# Patient Record
Sex: Male | Born: 1935 | Race: White | Hispanic: No | Smoking: Never smoker
Health system: Southern US, Community
[De-identification: ages and names within clinical notes are randomized; demographics above are authoritative.]

## PROBLEM LIST (undated history)

## (undated) DIAGNOSIS — I10 Essential (primary) hypertension: Secondary | ICD-10-CM

## (undated) DIAGNOSIS — J449 Chronic obstructive pulmonary disease, unspecified: Secondary | ICD-10-CM

## (undated) HISTORY — PX: EYE SURGERY: SHX253

---

## 2001-05-23 ENCOUNTER — Encounter: Payer: Self-pay | Admitting: Emergency Medicine

## 2001-05-23 ENCOUNTER — Inpatient Hospital Stay (HOSPITAL_COMMUNITY): Admission: EM | Admit: 2001-05-23 | Discharge: 2001-05-28 | Payer: Self-pay | Admitting: Emergency Medicine

## 2001-05-25 ENCOUNTER — Encounter: Payer: Self-pay | Admitting: Cardiology

## 2005-03-25 ENCOUNTER — Encounter: Payer: Self-pay | Admitting: Cardiology

## 2009-08-24 ENCOUNTER — Encounter: Payer: Self-pay | Admitting: Cardiology

## 2009-08-25 ENCOUNTER — Encounter: Payer: Self-pay | Admitting: Cardiology

## 2009-08-30 ENCOUNTER — Ambulatory Visit: Payer: Self-pay | Admitting: Cardiology

## 2009-08-30 ENCOUNTER — Encounter (INDEPENDENT_AMBULATORY_CARE_PROVIDER_SITE_OTHER): Payer: Self-pay | Admitting: *Deleted

## 2009-08-30 DIAGNOSIS — J449 Chronic obstructive pulmonary disease, unspecified: Secondary | ICD-10-CM

## 2009-08-30 DIAGNOSIS — R0602 Shortness of breath: Secondary | ICD-10-CM | POA: Insufficient documentation

## 2009-08-30 DIAGNOSIS — I10 Essential (primary) hypertension: Secondary | ICD-10-CM

## 2009-08-30 DIAGNOSIS — E785 Hyperlipidemia, unspecified: Secondary | ICD-10-CM | POA: Insufficient documentation

## 2009-08-30 DIAGNOSIS — R9430 Abnormal result of cardiovascular function study, unspecified: Secondary | ICD-10-CM | POA: Insufficient documentation

## 2009-08-30 DIAGNOSIS — K219 Gastro-esophageal reflux disease without esophagitis: Secondary | ICD-10-CM

## 2009-08-30 DIAGNOSIS — J4489 Other specified chronic obstructive pulmonary disease: Secondary | ICD-10-CM | POA: Insufficient documentation

## 2009-09-03 ENCOUNTER — Ambulatory Visit: Payer: Self-pay | Admitting: Cardiology

## 2009-09-03 ENCOUNTER — Inpatient Hospital Stay (HOSPITAL_BASED_OUTPATIENT_CLINIC_OR_DEPARTMENT_OTHER): Admission: RE | Admit: 2009-09-03 | Discharge: 2009-09-03 | Payer: Self-pay | Admitting: Cardiology

## 2009-09-07 ENCOUNTER — Encounter: Payer: Self-pay | Admitting: Cardiology

## 2009-09-07 ENCOUNTER — Observation Stay (HOSPITAL_COMMUNITY): Admission: RE | Admit: 2009-09-07 | Discharge: 2009-09-09 | Payer: Self-pay | Admitting: Cardiovascular Disease

## 2009-09-07 ENCOUNTER — Ambulatory Visit: Payer: Self-pay | Admitting: Cardiovascular Disease

## 2009-09-08 ENCOUNTER — Encounter: Payer: Self-pay | Admitting: Cardiology

## 2009-09-12 ENCOUNTER — Telehealth (INDEPENDENT_AMBULATORY_CARE_PROVIDER_SITE_OTHER): Payer: Self-pay | Admitting: *Deleted

## 2009-10-01 ENCOUNTER — Ambulatory Visit: Payer: Self-pay | Admitting: Cardiology

## 2009-10-01 DIAGNOSIS — I2510 Atherosclerotic heart disease of native coronary artery without angina pectoris: Secondary | ICD-10-CM | POA: Insufficient documentation

## 2009-10-01 DIAGNOSIS — I498 Other specified cardiac arrhythmias: Secondary | ICD-10-CM

## 2009-10-03 ENCOUNTER — Encounter: Payer: Self-pay | Admitting: Cardiology

## 2009-10-24 ENCOUNTER — Telehealth (INDEPENDENT_AMBULATORY_CARE_PROVIDER_SITE_OTHER): Payer: Self-pay | Admitting: *Deleted

## 2009-10-25 ENCOUNTER — Encounter: Payer: Self-pay | Admitting: Cardiology

## 2009-10-30 ENCOUNTER — Encounter: Payer: Self-pay | Admitting: Cardiology

## 2009-11-02 ENCOUNTER — Telehealth (INDEPENDENT_AMBULATORY_CARE_PROVIDER_SITE_OTHER): Payer: Self-pay | Admitting: *Deleted

## 2010-01-27 ENCOUNTER — Encounter: Payer: Self-pay | Admitting: Cardiology

## 2010-03-27 ENCOUNTER — Ambulatory Visit: Payer: Self-pay | Admitting: Cardiology

## 2010-05-10 ENCOUNTER — Encounter: Payer: Self-pay | Admitting: Cardiology

## 2010-05-15 ENCOUNTER — Encounter (INDEPENDENT_AMBULATORY_CARE_PROVIDER_SITE_OTHER): Payer: Self-pay | Admitting: *Deleted

## 2010-08-22 NOTE — Letter (Signed)
Summary: External Correspondence/ [facility]  External Correspondence/ [facility]   Imported By: Dorise Hiss 08/30/2009 16:18:09  _____________________________________________________________________  External Attachment:    Type:   Image     Comment:   External Document

## 2010-08-22 NOTE — Assessment & Plan Note (Signed)
Summary: [facility] 2 WK FU   Visit Type:  hospital follow-up Primary Provider:  [name]  CC:  hospital follow-up visit.  History of Present Illness: 77 year old male that I recently saw on August 30, 2009 4 for evaluation of abnormal stress echocardiogram and chest pain. He was scheduled for a cardiac catheterization which was performed on September 03, 2009. This revealed Left  main had 25% stenosis.  The LAD had a long proximal and ostial 30% stenosis.  There was mid long 60% stenosis.  The vessel was somewhat small and did not wrap the apex.  First diagonal was small with ostial 99% stenosis.There was a 60 RCA.  There was mid long 70% stenosis.  The EF was 65% with normal wall motion. The patient subsequently had PCI of the right coronary artery with 3 drug-eluting stents. He did have a relook catheterization secondary to asymptomatic ST elevation and the stents were patent. Apparently the patient also had brief nonsustained SVT. Since discharge  he continues to have dyspnea on exertion which is unchanged. It is relieved with rest. There is no orthopnea, PND or pedal edema. He has not had palpitations or syncope. He occasionally feels a pain in his chest when he takes a deep breath but this is a chronic issue. He otherwise has not had exertional chest pain.  Preventive Screening-Counseling & Management  Alcohol-Tobacco     Smoking Status: quit     Year Quit: 1980's  Current Medications (verified): 1)  Vitamin B-12 1000 Mcg Tabs (Cyanocobalamin) .... Take 1 Tablet By Mouth Once A Day 2)  Ventolin Hfa 108 (90 Base) Mcg/act Aers (Albuterol Sulfate) .... As Needed 3)  Prilosec 20 Mg Cpdr (Omeprazole) .... Take 1 Tablet By Mouth Once A Day As Needed 4)  Nitrostat 0.4 Mg Subl (Nitroglycerin) .... Use As Directed 5)  Aspirin Ec 325 Mg Tbec (Aspirin) .... Take One Tablet By Mouth Daily 6)  Simvastatin 40 Mg Tabs (Simvastatin) .... Take One Tablet By Mouth Daily At Bedtime 7)  Cardizem Cd 240  Mg Xr24h-Cap (Diltiazem Hcl Coated Beads) .... Take 1 Tablet By Mouth Once A Day 8)  Plavix 75 Mg Tabs (Clopidogrel Bisulfate) .... Take 1 Tablet By Mouth Once A Day 9)  Lisinopril 5 Mg Tabs (Lisinopril) .... Take 1 Tablet By Mouth Once A Day 10)  Flonase 50 Mcg/act Susp (Fluticasone Propionate) .... Two Sprays/nostril Daily  Allergies (verified): No Known Drug Allergies  Comments:  Nurse/Medical Assistant: The patient's medications and allergies were reviewed with the patient and were updated in the Medication and Allergy Lists. Bottles reviewed.  Past History:  Past Medical History: Hyperlipidemia COPD BPH GERD coronary artery disease  Past Surgical History: Reviewed history from 08/30/2009 and no changes required. Prior Pneumothorax after rib fx; chest tube  Social History: Reviewed history from 08/30/2009 and no changes required. Retired  Married  Tobacco Use - Former.  Alcohol Use - no  Review of Systems       no fevers or chills, productive cough, hemoptysis, dysphasia, odynophagia, melena, hematochezia, dysuria, hematuria, rash, seizure activity, orthopnea, PND, pedal edema, claudication. Remaining systems are negative.   Vital Signs:  Patient profile:   77 year old male Height:      66 inches Weight:      135 pounds Pulse rate:   55 / minute BP sitting:   127 / 65  (left arm) Cuff size:   regular  Vitals Entered By: Carlye Grippe (October 01, 2009 10:53 AM) CC: hospital follow-up  visit   Physical Exam  General:  Well-developed well-nourished in no acute distress.  Skin is warm and dry.  HEENT is normal.  Neck is supple. No thyromegaly.  Chest is clear to auscultation with normal expansion.  Cardiovascular exam is regular rate and rhythm.  Abdominal exam nontender or distended. No masses palpated. Groin with no bruit bilaterally. Some tenderness to palpation in the medial aspect of the left knee but no cords palpated. Extremities show no edema. neuro  grossly intact    Impression & Recommendations:  Problem # 1:  CAD (ICD-414.00) Patient doing well status post PCI of his right coronary artery. Continue aspirin, Plavix and statin. The following medications were removed from the medication list:    Amlodipine Besylate 5 Mg Tabs (Amlodipine besylate) .Marland Kitchen... Take one tablet by mouth daily His updated medication list for this problem includes:    Nitrostat 0.4 Mg Subl (Nitroglycerin) ..... Use as directed    Aspirin Ec 325 Mg Tbec (Aspirin) .Marland Kitchen... Take one tablet by mouth daily    Cardizem Cd 240 Mg Xr24h-cap (Diltiazem hcl coated beads) .Marland Kitchen... Take 1 tablet by mouth once a day    Plavix 75 Mg Tabs (Clopidogrel bisulfate) .Marland Kitchen... Take 1 tablet by mouth once a day    Lisinopril 5 Mg Tabs (Lisinopril) .Marland Kitchen... Take 1 tablet by mouth once a day  Problem # 2:  SUPRAVENTRICULAR TACHYCARDIA (ICD-427.89) No symptoms of palpitations. Continue Cardizem. The following medications were removed from the medication list:    Amlodipine Besylate 5 Mg Tabs (Amlodipine besylate) .Marland Kitchen... Take one tablet by mouth daily His updated medication list for this problem includes:    Nitrostat 0.4 Mg Subl (Nitroglycerin) ..... Use as directed    Aspirin Ec 325 Mg Tbec (Aspirin) .Marland Kitchen... Take one tablet by mouth daily    Cardizem Cd 240 Mg Xr24h-cap (Diltiazem hcl coated beads) .Marland Kitchen... Take 1 tablet by mouth once a day    Plavix 75 Mg Tabs (Clopidogrel bisulfate) .Marland Kitchen... Take 1 tablet by mouth once a day    Lisinopril 5 Mg Tabs (Lisinopril) .Marland Kitchen... Take 1 tablet by mouth once a day  Problem # 3:  HYPERLIPIDEMIA (ICD-272.4)  Continue statin. Check lipids and liver. His updated medication list for this problem includes:    Simvastatin 40 Mg Tabs (Simvastatin) .Marland Kitchen... Take one tablet by mouth daily at bedtime  Orders: T-Lipid Profile ([id number]) T-Hepatic Function [phone])  Problem # 4:  CHRONIC OBSTRUCTIVE PULMONARY DISEASE (ICD-496)  His updated medication list for this  problem includes:    Ventolin Hfa 108 (90 Base) Mcg/act Aers (Albuterol sulfate) .Marland Kitchen... As needed  Problem # 5:  ESSENTIAL HYPERTENSION, BENIGN (ICD-401.1) Blood pressure controlled on present medications. Will continue. The following medications were removed from the medication list:    Amlodipine Besylate 5 Mg Tabs (Amlodipine besylate) .Marland Kitchen... Take one tablet by mouth daily His updated medication list for this problem includes:    Aspirin Ec 325 Mg Tbec (Aspirin) .Marland Kitchen... Take one tablet by mouth daily    Cardizem Cd 240 Mg Xr24h-cap (Diltiazem hcl coated beads) .Marland Kitchen... Take 1 tablet by mouth once a day    Lisinopril 5 Mg Tabs (Lisinopril) .Marland Kitchen... Take 1 tablet by mouth once a day  Problem # 6:  GASTROESOPHAGEAL REFLUX DISEASE (ICD-530.81)  His updated medication list for this problem includes:    Prilosec 20 Mg Cpdr (Omeprazole) .Marland Kitchen... Take 1 tablet by mouth once a day as needed  Patient Instructions: 1)  Your physician wants you to follow-up in:  6 months. You will receive a reminder letter in the mail one-two months in advance. If you don't receive a letter, please call our office to schedule the follow-up appointment. 2)  Your physician recommends that you go to the [facility] for a FASTING lipid profile and liver function labs. Do not eat or drink after midnight.  3)  Your physician recommends that you continue on your current medications as directed. Please refer to the Current Medication list given to you today.  Prevention & Chronic Care Immunizations   Influenza vaccine: Not documented    Tetanus booster: Not documented    Pneumococcal vaccine: Not documented    H. zoster vaccine: Not documented  Colorectal Screening   Hemoccult: Not documented    Colonoscopy: Not documented  Other Screening   PSA: Not documented   Smoking status: quit  (10/01/2009)  Lipids   Total Cholesterol: Not documented   LDL: Not documented   LDL Direct: Not documented   HDL: Not documented    Triglycerides: Not documented    SGOT (AST): Not documented   SGPT (ALT): Not documented   Alkaline phosphatase: Not documented   Total bilirubin: Not documented  Hypertension   Last Blood Pressure: 127 / 65  (10/01/2009)   Serum creatinine: Not documented   Serum potassium Not documented  Self-Management Support :    Hypertension self-management support: Not documented    Lipid self-management support: Not documented

## 2010-08-22 NOTE — Letter (Signed)
Summary: External Correspondence/ [facility]  External Correspondence/ [facility]   Imported By: Dorise Hiss 08/31/2009 08:41:01  _____________________________________________________________________  External Attachment:    Type:   Image     Comment:   External Document

## 2010-08-22 NOTE — Letter (Signed)
Summary: Cardiac Cath Instructions - [facility]  [facility] at [facility] [address]   [place], [place] [address]   Phone: [phone]  Fax: [phone]     08/30/2009 MRN: [id number]  Tyler Watkins [address] [place], [place]  [address]  Dear Mr. BELTON, PEPLINSKI are scheduled for a Cardiac Catheterization on Monday, September 03, 2009 with Dr. Antoine Poche or [place].  Please arrive to the 1st floor of the Heart and Vascular Center at [facility] at 7:30 am on the day of your procedure. Please do not arrive before 6:30 a.m. Call the Heart and Vascular Center at [phone] if you are unable to make your appointment. The Code to get into the parking garage under the building is 0900. Take the elevators to the 1st floor. You must have someone to drive you home. Someone must be with you for the first 24 hours after you arrive home. Please wear clothes that are easy to get on and off and wear slip-on shoes. Do not eat or drink after midnight except water with your medications that morning. Bring all your medications and current insurance cards with you.  ___ DO NOT take these medications before your procedure:  __X_ Make sure you take your aspirin.  _X__ You may take ALL of your medications with water that morning.  ___ DO NOT take ANY medications before your procedure.  ___ Pre-med instructions:  ________________________________________________________________________  The usual length of stay after your procedure is 2 to 3 hours. This can vary.  If you have any questions, please call the office at the number listed above.  Cyril Loosen, RN, BSN             Directions to the [facility]  Please Note : Park in [place] under the building not the parking deck.  From [employer]: Turn onto [address] Left onto [address] (1st stoplight) Right at the brick entrance to the hospital (Main circle  drive) Bear to the right and you will see a blue sign "Heart and Vascular Center" Parking garage is a sharp right to get through the gate put in the code _0900______. Once you park, take the elevator to the first floor. Please do not arrive before 0630am. The building will be dark before that time.   From [address] Turn onto [address] Turn left into the brick entrance to the hospital (Main circle drive) Bear to the right and you will see a blue sign "[facility]" Parking garage is a sharp right, to get thru the gate put in the code _0900___. Once you park, take the elevator to the first floor. Please do not arrive before 0630am. The building will be dark before that time

## 2010-08-22 NOTE — Letter (Signed)
Summary: Engineer, materials at [facility]  [address]   [place], [place] [address]   Phone: [phone]  Fax: [phone]        May 15, 2010 MRN: [id number]    Tyler Watkins [address] [place], [place]  [address]    Dear Mr. BENISH,  Your test ordered by Selena Batten has been reviewed by your physician (or physician assistant) and was found to be normal or stable. Your physician (or physician assistant) felt no changes were needed at this time.  ____ Echocardiogram  ____ Cardiac Stress Test  __X__ Lab Work-We will repeat labs in 3 months.  ____ Peripheral vascular study of arms, legs or neck  ____ CT scan or X-ray  ____ Lung or Breathing test  ____ Other:   Thank you.   Cyril Loosen, RN, BSN    Duane Boston, M.D., F.A.C.C. Thressa Sheller, M.D., F.A.C.C. Oneal Grout, M.D., F.A.C.C. Cheree Ditto, M.D., F.A.C.C. Daiva Nakayama, M.D., F.A.C.C. Kenney Houseman, M.D., F.A.C.C. Jeanne Ivan, PA-C

## 2010-08-22 NOTE — Assessment & Plan Note (Signed)
Summary: 6 MO FU PER SEPT REMINDER-SRS   Primary Tyler Watkins:  Tyler Watkins   History of Present Illness: 77 year old male that I  saw on August 30, 2009  for evaluation of abnormal stress echocardiogram and chest pain. He was scheduled for a cardiac catheterization which was performed on September 03, 2009. This revealed Left  main had 25% stenosis.  The LAD had a long proximal and ostial 30% stenosis.  There was mid long 60% stenosis.  The vessel was somewhat small and did not wrap the apex.  First diagonal was small with ostial 99% stenosis.There was a 60 RCA.  There was mid long 70% stenosis.  The EF was 65% with normal wall motion. The patient subsequently had PCI of the right coronary artery with 3 drug-eluting stents. He did have a relook catheterization secondary to asymptomatic ST elevation and the stents were patent. Apparently the patient also had brief nonsustained SVT. I last saw him in March of 2011. Since then, the patient has dyspnea with more extreme activities but not with routine activities. It is relieved with rest. It is not associated with chest pain. There is no orthopnea, PND or pedal edema. There is no syncope or palpitations. There is no exertional chest pain.   Preventive Screening-Counseling & Management  Alcohol-Tobacco     Smoking Status: quit     Year Quit: '80's  Current Medications (verified): 1)  Ventolin Hfa 108 (90 Base) Mcg/act Aers (Albuterol Sulfate) .... As Needed 2)  Nitrostat 0.4 Mg Subl (Nitroglycerin) .... Use As Directed 3)  Aspirin 81 Mg Tbec (Aspirin) .... Take 1 Tablet By Mouth Once A Day 4)  Plavix 75 Mg Tabs (Clopidogrel Bisulfate) .... Take 1 Tablet By Mouth Once A Day 5)  Lisinopril 5 Mg Tabs (Lisinopril) .... Take 1 Tablet By Mouth Once A Day 6)  Flonase 50 Mcg/act Susp (Fluticasone Propionate) .... Two Sprays/nostril Daily As Needed 7)  Alprazolam 1 Mg Tabs (Alprazolam) .... Take 1 Tablet By Mouth Two Times A Day As Needed 8)  Ativan 0.5 Mg Tabs  (Lorazepam) .... Take 1 Tab By Mouth At Bedtime As Needed 9)  Pravachol 40 Mg Tabs (Pravastatin Sodium) .... Take 1 Tab By Mouth At Bedtime 10)  Cardizem Cd 180 Mg Xr24h-Cap (Diltiazem Hcl Coated Beads) .... Take 1 Tablet By Mouth Once A Day  Allergies (verified): No Known Drug Allergies  Past History:  Past Medical History: Hyperlipidemia COPD BPH GERD coronary artery disease Hypertension  Past Surgical History: Reviewed history from 08/30/2009 and no changes required. Prior Pneumothorax after rib fx; chest tube  Social History: Reviewed history from 08/30/2009 and no changes required. Retired  Married  Tobacco Use - Former.  Alcohol Use - no  Review of Systems       no fevers or chills, productive cough, hemoptysis, dysphasia, odynophagia, melena, hematochezia, dysuria, hematuria, rash, seizure activity, orthopnea, PND, pedal edema, claudication. Remaining systems are negative.  Vital Signs:  Patient profile:   77 year old male Height:      67 inches Weight:      127.50 pounds Pulse rate:   56 / minute BP sitting:   156 / 67  (left arm) Cuff size:   regular  Vitals Entered By: Hoover Brunette, LPN (March 27, 2010 12:53 PM) Is Patient Diabetic? No Comments routine 6 month f/u   Physical Exam  General:  Well-developed well-nourished in no acute distress.  Skin is warm and dry.  HEENT is normal.  Neck is supple. No  thyromegaly.  Chest is clear to auscultation with normal expansion.  Cardiovascular exam is regular rate and rhythm.  Abdominal exam nontender or distended. No masses palpated. Extremities show no edema. neuro grossly intact    EKG  Procedure date:  03/27/2010  Findings:      NSR, no ST changes.  Impression & Recommendations:  Problem # 1:  SUPRAVENTRICULAR TACHYCARDIA (ICD-427.89) No recurrent symptoms; resume cardizem at previous dose. The following medications were removed from the medication list:    Cardizem Cd 240 Mg Xr24h-cap  (Diltiazem hcl coated beads) .Marland Kitchen... Take 1 tablet by mouth once a day His updated medication list for this problem includes:    Nitrostat 0.4 Mg Subl (Nitroglycerin) ..... Use as directed    Aspirin 81 Mg Tbec (Aspirin) .Marland Kitchen... Take 1 tablet by mouth once a day    Plavix 75 Mg Tabs (Clopidogrel bisulfate) .Marland Kitchen... Take 1 tablet by mouth once a day    Lisinopril 5 Mg Tabs (Lisinopril) .Marland Kitchen... Take 1 tablet by mouth once a day    Cardizem Cd 180 Mg Xr24h-cap (Diltiazem hcl coated beads) .Marland Kitchen... Take 1 tablet by mouth once a day  Orders: EKG w/ Interpretation (93000)  Problem # 2:  CAD (ICD-414.00) Continue ASA, plavix, and ACE-I; resume statin. The following medications were removed from the medication list:    Cardizem Cd 240 Mg Xr24h-cap (Diltiazem hcl coated beads) .Marland Kitchen... Take 1 tablet by mouth once a day His updated medication list for this problem includes:    Nitrostat 0.4 Mg Subl (Nitroglycerin) ..... Use as directed    Aspirin 81 Mg Tbec (Aspirin) .Marland Kitchen... Take 1 tablet by mouth once a day    Plavix 75 Mg Tabs (Clopidogrel bisulfate) .Marland Kitchen... Take 1 tablet by mouth once a day    Lisinopril 5 Mg Tabs (Lisinopril) .Marland Kitchen... Take 1 tablet by mouth once a day    Cardizem Cd 180 Mg Xr24h-cap (Diltiazem hcl coated beads) .Marland Kitchen... Take 1 tablet by mouth once a day  Problem # 3:  CHRONIC OBSTRUCTIVE PULMONARY DISEASE (ICD-496)  His updated medication list for this problem includes:    Ventolin Hfa 108 (90 Base) Mcg/act Aers (Albuterol sulfate) .Marland Kitchen... As needed  Problem # 4:  HYPERLIPIDEMIA (ICD-272.4)  DC zocor; pravachol 40 mg by mouth daily; lipids and liver in six weeks. The following medications were removed from the medication list:    Simvastatin 40 Mg Tabs (Simvastatin) .Marland Kitchen... Take one tablet by mouth daily at bedtime His updated medication list for this problem includes:    Pravachol 40 Mg Tabs (Pravastatin sodium) .Marland Kitchen... Take 1 tab by mouth at bedtime  Future Orders: T-Lipid Profile ([id number]) ...  05/08/2010 T-Hepatic Function [id number]) ... 05/08/2010  Problem # 5:  ESSENTIAL HYPERTENSION, BENIGN (ICD-401.1) BP elevated; resume cardizem. The following medications were removed from the medication list:    Cardizem Cd 240 Mg Xr24h-cap (Diltiazem hcl coated beads) .Marland Kitchen... Take 1 tablet by mouth once a day His updated medication list for this problem includes:    Aspirin 81 Mg Tbec (Aspirin) .Marland Kitchen... Take 1 tablet by mouth once a day    Lisinopril 5 Mg Tabs (Lisinopril) .Marland Kitchen... Take 1 tablet by mouth once a day    Cardizem Cd 180 Mg Xr24h-cap (Diltiazem hcl coated beads) .Marland Kitchen... Take 1 tablet by mouth once a day  Problem # 6:  DYSPNEA (ICD-786.05) Most likely due to COPD. The following medications were removed from the medication list:    Cardizem Cd 240 Mg Xr24h-cap (Diltiazem hcl coated  beads) .Marland Kitchen... Take 1 tablet by mouth once a day His updated medication list for this problem includes:    Aspirin 81 Mg Tbec (Aspirin) .Marland Kitchen... Take 1 tablet by mouth once a day    Lisinopril 5 Mg Tabs (Lisinopril) .Marland Kitchen... Take 1 tablet by mouth once a day    Cardizem Cd 180 Mg Xr24h-cap (Diltiazem hcl coated beads) .Marland Kitchen... Take 1 tablet by mouth once a day  Patient Instructions: 1)  Stop Zocor 2)  Pravachol 40mg  at bedtime  3)  Cardizem CD 180mg  daily 4)  Labs:  lipid/liver in 6 weeks 5)  Follow up in  6 months Prescriptions: CARDIZEM CD 180 MG XR24H-CAP (DILTIAZEM HCL COATED BEADS) Take 1 tablet by mouth once a day  #30 x 6   Entered by:   Hoover Brunette, LPN   Authorized by:   Ferman Hamming, MD, [facility]   Signed by:   Hoover Brunette, LPN on 16/04/9603   Method used:   Electronically to        [facility]* (retail)       [address]       [place], [place]  [address]       Ph: [phone]       Fax: [phone]   RxID:   [id number] PRAVACHOL 40 MG TABS (PRAVASTATIN SODIUM) Take 1 tab by mouth at bedtime  #30 x 6   Entered by:   Hoover Brunette, LPN   Authorized by:   Ferman Hamming, MD, [facility]   Signed by:   Hoover Brunette, LPN on 05/17/2535   Method used:   Electronically to        [facility]* (retail)       [address]       [place], [place]  [address]       Ph: [phone]       Fax: [phone]   RxID:   [id number]

## 2010-08-22 NOTE — Letter (Signed)
Summary: External Correspondence/ FAXED PRE-CATH ORDER  External Correspondence/ FAXED PRE-CATH ORDER   Imported By: Dorise Hiss 08/31/2009 08:50:33  _____________________________________________________________________  External Attachment:    Type:   Image     Comment:   External Document

## 2010-08-22 NOTE — Letter (Signed)
Summary: Engineer, materials at [facility]  [address]   [place], [place] [address]   Phone: [phone]  Fax: [phone]        August 30, 2009 MRN: [id number]    Tyler Watkins [address] [place], [place]  [address]    Dear [name],  Your test ordered by Selena Batten has been reviewed by your physician (or physician assistant) and was found to be normal or stable. Your physician (or physician assistant) felt no changes were needed at this time.  ____ Echocardiogram  ____ Cardiac Stress Test  __X__ Lab Work  ____ Peripheral vascular study of arms, legs or neck  ____ CT scan or X-ray  ____ Lung or Breathing test  ____ Other:   Thank you.   Cyril Loosen, RN, BSN    Duane Boston, M.D., F.A.C.C. Thressa Sheller, M.D., F.A.C.C. Oneal Grout, M.D., F.A.C.C. Cheree Ditto, M.D., F.A.C.C. Daiva Nakayama, M.D., F.A.C.C. Kenney Houseman, M.D., F.A.C.C. Jeanne Ivan, PA-C

## 2010-08-22 NOTE — Assessment & Plan Note (Signed)
Summary: NP-PER Tyler Watkins ADD   Visit Type:  hospital follow-up Primary Provider:  [name]  CC:  new patient and abnormal stress test.  History of Present Illness: 77 year old male for evaluation of abnormal stress echocardiogram and chest pain. Patient admitted to the [facility] in early Sep 23, 2022 with episodes of shortness of breath and atypical chest pain. The patient ruled out for myocardial infarction and a stress echocardiogram was arranged for risk stratification. This was performed this morning. Patient developed hypertensive response, ST depression and there is evidence of inferior and apical ischemia. Because of the above we were asked to further evaluate. The patient does have chronic dyspnea on exertion relieved with rest. He attributes this to his COPD. However on the day of admission he had a more severe episode. He denies any orthopnea, PND, pedal edema, palpitations or syncope. He also notices a chest tightness with more extreme activities but not with routine activities. It is relieved with rest. There is no associated nausea or diaphoresis but there is shortness of breath. He states he has had this for years since fracturing a rib which caused a pneumothorax. He has had no rest symptoms. Also note he does have some mild pain in his calves with ambulation relieved with rest.  Preventive Screening-Counseling & Management  Alcohol-Tobacco     Smoking Status: quit     Year Quit: 1980's  Current Medications (verified): 1)  Vitamin B-12 1000 Mcg Tabs (Cyanocobalamin) .... Take 1 Tablet By Mouth Once A Day 2)  Ventolin Hfa 108 (90 Base) Mcg/act Aers (Albuterol Sulfate) .... As Needed 3)  Prilosec 20 Mg Cpdr (Omeprazole) .... Take 1 Tablet By Mouth Once A Day As Needed 4)  Nitrostat 0.4 Mg Subl (Nitroglycerin) .... Use As Directed  Allergies (verified): No Known Drug Allergies  Comments:  Nurse/Medical Assistant: The patient is currently on medications but does not know  the name or dosage at this time. Instructed to contact our office with details. Will update medication list at that time.  Past History:  Past Medical History: Hyperlipidemia COPD BPH GERD  Past Surgical History: Prior Pneumothorax after rib fx; chest tube  Family History: Reviewed history and no changes required. Father with COPD  Social History: Reviewed history and no changes required. Retired  Married  Tobacco Use - Former.  Alcohol Use - no Smoking Status:  quit  Review of Systems       Some dyspnea on exertion, claudication, and productive cough but no fevers or chills,  hemoptysis, dysphasia, odynophagia, melena, hematochezia, dysuria, hematuria, rash, seizure activity, orthopnea, PND, pedal edema. Remaining systems are negative.   Vital Signs:  Patient profile:   77 year old male Height:      66 inches Weight:      133 pounds BMI:     21.54 Pulse rate:   55 / minute BP sitting:   188 / 71  (left arm) Cuff size:   regular  Vitals Entered By: Carlye Grippe (August 30, 2009 9:09 AM) CC: new patient, abnormal stress test   Physical Exam  General:  Well developed/well nourished in NAD Skin warm/dry Patient not depressed No peripheral clubbing Back-normal HEENT-normal/normal eyelids Neck supple/normal carotid upstroke bilaterally; no bruits; no JVD; no thyromegaly chest - CTA/ normal expansion CV - RRR/normal S1 and S2; no murmurs, rubs or gallops;  PMI nondisplaced Abdomen -NT/ND, no HSM, no mass, + bowel sounds, no bruit 2+ femoral pulses, no bruits Ext-no edema, chords, diminished pulses bilaterally Neuro-grossly nonfocal  EKG  Procedure date:  08/25/2009  Findings:      Sinus bradycardia with no ST changes.  Impression & Recommendations:  Problem # 1:  DYSPNEA (ICD-786.05)  This could certainly be related to his lung disease. However he also has an abnormal cardiovascular functional study. Ischemia therefore could be contributing.  I feel that he needs cardiac catheterization. Risk and benefits have been discussed and he agrees to proceed. We will arrange this as an outpatient. We have instructed him that if he develops worsening symptoms he should be seen in the emergency room. In the meantime we will add aspirin and a statin.  Orders: T-Basic Metabolic Panel [id number]) T-CBC No Diff ([id number]) T-Protime, Auto ([id number]) T-PTT ([id number]) Cardiac Catheterization (Cardiac Cath)  His updated medication list for this problem includes:    Aspirin Ec 325 Mg Tbec (Aspirin) .Marland Kitchen... Take one tablet by mouth daily    Amlodipine Besylate 5 Mg Tabs (Amlodipine besylate) .Marland Kitchen... Take one tablet by mouth daily  Problem # 2:  CARDIOVASCULAR FUNCTION STUDY, ABNORMAL (ICD-794.30)  As per #1 proceed with cardiac catheterization.  Orders: T-Basic Metabolic Panel [id number]) T-CBC No Diff ([id number]) T-Protime, Auto ([id number]) T-PTT ([id number]) Cardiac Catheterization (Cardiac Cath)  Problem # 3:  ESSENTIAL HYPERTENSION, BENIGN (ICD-401.1)  Blood pressure elevated. I will add Norvasc 5 mg p.o. daily. I am not adding a beta blocker due to his baseline bradycardia.  His updated medication list for this problem includes:    Aspirin Ec 325 Mg Tbec (Aspirin) .Marland Kitchen... Take one tablet by mouth daily    Amlodipine Besylate 5 Mg Tabs (Amlodipine besylate) .Marland Kitchen... Take one tablet by mouth daily  Problem # 4:  HYPERLIPIDEMIA (ICD-272.4)  Add Zocor 40 mg p.o. daily. Check lipids and liver in 6 weeks.  His updated medication list for this problem includes:    Simvastatin 40 Mg Tabs (Simvastatin) .Marland Kitchen... Take one tablet by mouth daily at bedtime  Problem # 5:  CHRONIC OBSTRUCTIVE PULMONARY DISEASE (ICD-496)  Management per primary care. His updated medication list for this problem includes:    Ventolin Hfa 108 (90 Base) Mcg/act Aers (Albuterol sulfate) .Marland Kitchen... As needed  His updated medication list for this problem  includes:    Ventolin Hfa 108 (90 Base) Mcg/act Aers (Albuterol sulfate) .Marland Kitchen... As needed  Problem # 6:  GASTROESOPHAGEAL REFLUX DISEASE (ICD-530.81)  His updated medication list for this problem includes:    Prilosec 20 Mg Cpdr (Omeprazole) .Marland Kitchen... Take 1 tablet by mouth once a day as needed  His updated medication list for this problem includes:    Prilosec 20 Mg Cpdr (Omeprazole) .Marland Kitchen... Take 1 tablet by mouth once a day as needed  Patient Instructions: 1)  Your physician has requested that you have a cardiac catheterization.  Cardiac catheterization is used to diagnose and/or treat various heart conditions. Doctors may recommend this procedure for a number of different reasons. The most common reason is to evaluate chest pain. Chest pain can be a symptom of coronary artery disease (CAD), and cardiac catheterization can show whether plaque is narrowing or blocking your heart's arteries. This procedure is also used to evaluate the valves, as well as measure the blood flow and oxygen levels in different parts of your heart.  For further information please visit [email or web address]  Please follow instruction sheet, as given. 2)  Your physician discussed the risks, benefits and indications for preventive aspirin therapy. It is recommended that you start (or continue) taking 325 mg of aspirin a day. 3)  Your physician  recommends that you go to the [facility] for lab work this morning. 4)  Start Zocor (simvastatin) 40mg  by mouth at bedtime. 5)  Start Norvasc (Amlodipine) 5mg  by mouth once daily. 6)  Rest this weekend. If you have any chest pain or problems, report to the ER. Prescriptions: AMLODIPINE BESYLATE 5 MG TABS (AMLODIPINE BESYLATE) Take one tablet by mouth daily  #30 x 6   Entered by:   Cyril Loosen, RN, BSN   Authorized by:   Ferman Hamming, MD, [facility]   Signed by:   Cyril Loosen, RN, BSN on 08/30/2009   Method used:   Electronically to        [facility]*  (retail)       [address]       [place], [place]  [address]       Ph: [phone]       Fax: [phone]   RxID:   [id number]   Handout requested. SIMVASTATIN 40 MG TABS (SIMVASTATIN) Take one tablet by mouth daily at bedtime  #30 x 6   Entered by:   Cyril Loosen, RN, BSN   Authorized by:   Ferman Hamming, MD, [facility]   Signed by:   Cyril Loosen, RN, BSN on 08/30/2009   Method used:   Electronically to        [facility]* (retail)       [address]       [place], [place]  [address]       Ph: [phone]       Fax: [phone]   RxID:   [id number]   Handout requested.

## 2010-08-22 NOTE — Progress Notes (Signed)
Summary: Hospital follow up question/nosebleed  Phone Note Call from Patient Call back at Home Phone [phone]   Summary of Call: Pt's wife called stating her husband was recently dc'd from [facility]. She states someone called their home yesterday and asked him if he had monitor yet. He does not know this person's name but state's it is Dr. Ludwig Clarks nurse. "Follow-up request" form from [facility] and DC summary both state consider Holter. No order received for monitor. Discussed with Dr. Jens Som who states pt does not need monitor before follow up appt with him.  His wife also states he has started having a nosebleed today. She states it is bleeding "alot but not pouring out." She is advised to hold pressure to the nose for at least 15 minutes and check his blood pressure and notify us if it is elevated. If nosebleed continues, pt is advised to go to the ER for evaluation. Pt's wife verbalized understanding. Initial call taken by: Cyril Loosen, RN, BSN,  September 12, 2009 9:54 AM     Appended Document: Hospital follow up question/nosebleed Pt's wife called back and left message on voicemail that his BP was 134/54 HR 63. Notified pt that this BP was okay. He states nosebleed has stopped.

## 2010-08-22 NOTE — Letter (Signed)
Summary: [facility] D/C DR.KEVIN HOWARD  [facility] D/C DR.KEVIN HOWARD   Imported By: Zachary George 08/30/2009 09:01:19  _____________________________________________________________________  External Attachment:    Type:   Image     Comment:   External Document

## 2010-08-22 NOTE — Progress Notes (Signed)
Summary: 10/03/2009 LAB RESULTS  Phone Note Call from Patient Call back at Home Phone [phone]   Reason for Call: Lab or Test Results Summary of Call: Called for labs 10/03/2009 results Initial call taken by: Dorise Hiss  Follow-up for Phone Call        Pt notified results are pending review by MD. He will be notified of results once MD reviews them. Pt verbalized understanding.  Follow-up by: Cyril Loosen, RN, BSN,  October 24, 2009 11:49 AM

## 2010-08-22 NOTE — Progress Notes (Signed)
Summary: REQUEST FINAL LABS BE SENT TO PCP  Phone Note Call from Patient Call back at Home Phone [phone]   Caller: Spouse Call For: NURSE Summary of Call: wife call and request lab results be sent to Dr. [name] once all is received. Nurse informed wife that still awaiting result of added lab test.   Initial call taken by: Carlye Grippe,  November 02, 2009 3:14 PM  Follow-up for Phone Call        Pt notified of results and these results faxed to Dr. [name]. Follow-up by: Cyril Loosen, RN, BSN,  November 05, 2009 2:48 PM

## 2010-10-10 LAB — CBC
HCT: 33.3 % — ABNORMAL LOW (ref 39.0–52.0)
HCT: 41.6 % (ref 39.0–52.0)
Hemoglobin: 14.3 g/dL (ref 13.0–17.0)
MCHC: 34.4 g/dL (ref 30.0–36.0)
MCHC: 35.1 g/dL (ref 30.0–36.0)
Platelets: 200 10*3/uL (ref 150–400)
RBC: 4.48 MIL/uL (ref 4.22–5.81)
RDW: 12.1 % (ref 11.5–15.5)
RDW: 12.3 % (ref 11.5–15.5)

## 2010-10-10 LAB — BASIC METABOLIC PANEL
BUN: 8 mg/dL (ref 6–23)
CO2: 26 mEq/L (ref 19–32)
CO2: 26 mEq/L (ref 19–32)
GFR calc non Af Amer: >60 mL/min (ref >60)
GFR calc non Af Amer: >60 mL/min (ref >60)
Glucose, Bld: 102 mg/dL — ABNORMAL HIGH (ref 70–99)
Glucose, Bld: 110 mg/dL — ABNORMAL HIGH (ref 70–99)
Potassium: 3.5 mEq/L (ref 3.5–5.1)
Potassium: 3.8 mEq/L (ref 3.5–5.1)
Sodium: 138 mEq/L (ref 135–145)

## 2010-12-06 NOTE — Procedures (Signed)
[facility]  Patient:    Tyler Watkins, Tyler Watkins Visit Number: [id number] MRN: [id number]          Service Type: MED Location: 2A A210 01 Attending Physician:  Annamarie Dawley Dictated by:   Kari Baars, M.D. Proc. Date: 05/25/01 Admit Date:  05/23/2001                      Pulmonary Function Test Inter.  RESULTS: 1. Spirometry, though a poor tracing, is normal with no evidence of air flow    obstruction. Dictated by:   Kari Baars, M.D. Attending Physician:  Annamarie Dawley DD:  05/26/01 TD:  05/26/01 Job: [id number]

## 2010-12-06 NOTE — H&P (Signed)
[facility]  Patient:    Tyler Watkins, Tyler Watkins Visit Number: [id number] MRN: [id number]          Service Type: MED Location: 2A A210 01 Attending Physician:  Annamarie Dawley Dictated by:   Damaris Schooner, M.D. Admit Date:  05/23/2001                           History and Physical  CHIEF COMPLAINT:  This is a 77 year old male, retired.  FAMILY HISTORY:  Noncontributory.  HABITS:  Noncontributory.  PAST MEDICAL HISTORY:  He has had no serious illnesses in the past, except had a punctured lung while working years ago.  No sequela.  PAST SURGICAL HISTORY:  No operations.  HISTORY OF PRESENT ILLNESS:  The patient has been seen in the office because of hypertension.  He has been extremely nervous, and noticed that he would become extremely weak, and felt like he was going to pass out whenever he would sit up.  While lying down, he was perfectly normal.  He was also burning around the opening of the urethra, but no dysuria.  The patient came to the emergency room and a CT of the brain was taken.  All laboratory studies were normal, but because of the syncope that the patient had prior to coming to the hospital, and the fact that he would feel faint when he sat up, IVs were done, but he still continued to have the same trouble.  It was felt that he should be admitted, to see if we can find out what caused the syncope, and treat for hypertension.  He also complains of having difficulty in swallowing, and a gastroenterology consultation will be ordered.  PHYSICAL EXAMINATION  GENERAL:  A well-developed, fairly well-nourished male, lying in bed.  VITAL SIGNS:  Temperature 96.7 degrees, pulse 85, respirations 16, blood pressure 172/80.  Oxygen saturation was 100%.  HEENT:  Within normal limits.  NECK:  No adenopathy.  Thyroid was not palpable.  CHEST:  Equal bilateral expansion of the chest.  No rales or rhonchi.  HEART:  Normal sinus rhythm without  any murmurs or thrills.  No evidence of enlargement.  ABDOMEN:  Liver, kidneys, spleen not palpable.  Normal peristalsis.  No masses or rigidity.  GENITOURINARY:  The patient was irritated around the opening of the urethra, but otherwise normal findings.  RECTAL:  Deferred.  SKIN:  Warm, moist, elastic.  TENTATIVE DIAGNOSES:  Syncope and hypertension, etiologies undetermined. Dictated by:   Damaris Schooner, M.D. Attending Physician:  Annamarie Dawley DD:  05/23/01 TD:  05/24/01 Job: [id number]

## 2010-12-06 NOTE — Consult Note (Signed)
[facility]  Patient:    Tyler Watkins, Tyler Watkins Visit Number: [id number] MRN: [id number]          Service Type: MED Location: 2A A210 01 Attending Physician:  Annamarie Dawley Dictated by:   Gardiner Coins, P.A. Proc. Date: 05/23/01 Admit Date:  05/23/2001                            Consultation Report  REFERRING PHYSICIAN:  Damaris Schooner, M.D.  REASON FOR CONSULTATION:  Abdominal pain.  HISTORY OF PRESENT ILLNESS:  Mr. Standing is a very nice 77 year old white male admitted earlier today with syncope and hypertension.  He was in his usual state of good health until last week when he developed some sinus problems. He was seen by Dr. Esmeralda Arthur and started on Biaxin and Claritin D for sinusitis. Over the last 2 days he has noticed increased urinary frequency, sensations of incomplete voiding, and a "irritated" feeling in the suprapubic area.   No hematuria, dysuria or decreased urinary flow.  He has had increased nocturia having to get multiple times at night.  Last night got up and developed up "swimmy headed" feeling after urinating.  His son reports he did have a syncopal episode.  In the emergency department CBC, PT, INR, PTT, urinalysis and cardiac enzymes were all normal.  CMET was normal except for elevated glucose of 167.  No history of heart disease or prostate problems.  He has a longstanding intermittent heartburn for which he takes over-the-counter product, possibly Tums or Gaviscon.  He also has intermittent solid food and pill dysphagia, and early satiety, especially when his heart burn has been symptomatic.  Denies any abdominal pain other than suprapubic irritation.  No nausea or vomiting.  Generally moves his bowels once a day. No bright red blood per rectum or melena.  Has occasional rare constipation for which he uses milk of magnesia with good results on a p.r.n. basis.  Has had some hard stools over the last couple of days.  Last bowel  movement yesterday.  No history of EGD or total colonoscopy.  No family history of colon cancer.  His brother has end stage cryptogenic cirrhosis.  Patient denies any history of hypertension or elevated cholesterol.  No prior history of urinary problems like this.  Did have some swimmy headed feelings previously with rib fractures and pneumothorax requiring test tube but none since then.  MEDICATIONS:  Medications prior to admission: 1. Biaxin XL and Claritin D b.i.d. started last week for sinusitis. 2. Also uses over-the-counter antacids p.r.n. heartburn. 3. BC powder p.r.n. aches and pains although he states he uses this rarely    less than 1 time per week.  ALLERGIES:  No known drug allergies.  PAST MEDICAL HISTORY:  Mr. Mcglocklin reports he has enjoyed good health.  He denies any history of heart disease, liver disease, renal disease, diabetes, hypertension, elevated cholesterol.  Does have a history of sinusitis and allergic rhinitis.  PAST SURGICAL HISTORY:  Patient had chest tube placed 8 or 9 years ago after falling and fracturing his ribs with subsequent pneumothorax.  Denies any other surgical procedures.  FAMILY HISTORY:  No family history of colon cancer.  One brother alive with cryptogenic cirrhosis.  He has 1 son with gastroesophageal reflux disease.  SOCIAL HISTORY:  Mr. Hilleary is widowed since 88.  His son lives with him. 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 No alcohol use and no history of alcohol abuse.  Used to smoke 1 pack of cigarettes per week but quit after pneumothorax.  No illegal drug use.  REVIEW OF SYSTEMS:  General:  As in HPI.  In addition he did chill and a fever in Dr. Moss Mc office before being started on antibiotics.  Appetite has been decreased over the last couple of days but his weight has been stable. Cardiovascular:  Occasional chest tightness especially at night with postnasal drip.  Has associated dyspnea with  this.  No palpitations.  No history of heart disease.  Sleeps flat in the bed without pillows.  Pulmonary: Occasional dyspnea with colds and with this chest tightness at night.  No exertional dyspnea.  No cough.  GI:  Please see HPI.  GU:  Please see HPI. Musculoskeletal:  No extremity weakness.  PHYSICAL EXAMINATION:  VITAL SIGNS:  Height 5 feet 8 inches.  Weight 128.3 pounds.  Pulse 83, respirations 18, blood pressure 163/85, afebrile in the emergency department with temperature 96.7.  GENERAL:  Pleasant, cooperative alert, Caucasian male, reclined comfortably in bed.  No acute distress. Answers questions quickly and appropriately. Multiple family members at bed side.  HEENT:  Atraumatic, normocephalic.  PERRL.  Scleral nonicteric.  Conjunctivae clear.  Oropharynx is pink and moist with upper and lower denture plates in place.  NECK:  Supple, no masses.  No adenopathy.  Trachea midline.  LUNGS:  Clear to auscultation bilaterally with decreased breath sounds.  CARDIOVASCULAR: S1, S2 regular rate and rhythm without appreciated murmur, rub or gallop.  ABDOMEN:  Positive normoactive bowel sounds.  Soft, flat, diffuse mild discomfort with deep palpation with the focus in the suprapubic area.  No appreciated masses or organomegaly.  RECTAL:  Patient declined.  EXTREMITIES:  No clubbing, cyanosis, or edema.  LABORATORY DATA:  Hemoglobin 14.7, hematocrit 41.6, WBC 8,000.  Platelet count 267,000.  MCV 87.2, RDW 11.9, 57% segs, 33% lymphs, 8% monos, 2% eos, 1% basophils.  PT 12.8, INR 1.0, PTT 27.  Sodium 135, potassium 3.7, chloride 103, CO2 25, BUN 8, creatinine 1.1.  Glucose 167, alk. phos. 93, SGOT 21, SGPT 23, total protein 6.7, albumin 4.1, calcium 9.5.  CK-MB 1.1.   Troponin I 0.01.  Urinalysis normal.  CT of the head negative.  IMPRESSION:  The patient is a very nice 77 year old male with syncope by history, increased urinary frequency and suprapubic "irritation."   Suspect that his abdominal discomfort in the suprapubic area is not related to GI  problem but perhaps BPH and Claritin D use.  He does have solid food and pill dysphagia and typical reflux symptoms with occasional early satiety as well as occasional longstanding constipation relieved by over-the-counter milk of magnesia use p.r.n.  He has been constipated over the last couple of days but did have bowel movement productive of hard stool yesterday.  Has never had EGD or total colonoscopy done.  SUGGESTIONS:  Start proton pump inhibitor therapy.  EGD in the near future. Once cardiology has seen the patient for further evaluation of longstanding typical reflux symptoms, dysphagia and early satiety.  Colorectal cancer screening, total colonoscopy once he has recovered from acute illness and is cleared for the procedure.  May use of milk of magnesia on a p.r.n. basis for constipation for the time being.  Most likely benefit from daily fiber supplementation once recovered from acute illness.  We would like to thank Dr. Esmeralda Arthur for allowing [place] to participate in the care of this  very nice gentleman. ictated by:   Gardiner Coins, P.A. Attending Physician:  Annamarie Dawley DD:  05/23/01 TD:  05/24/01 Job: [id number]

## 2013-07-21 ENCOUNTER — Emergency Department (HOSPITAL_COMMUNITY): Payer: Medicare Other

## 2013-07-21 ENCOUNTER — Encounter (HOSPITAL_COMMUNITY): Payer: Self-pay | Admitting: Emergency Medicine

## 2013-07-21 ENCOUNTER — Emergency Department (HOSPITAL_COMMUNITY)
Admission: EM | Admit: 2013-07-21 | Discharge: 2013-07-21 | Disposition: A | Discharge status: Home / Self Care | Payer: Medicare Other | Attending: Emergency Medicine | Admitting: Emergency Medicine

## 2013-07-21 DIAGNOSIS — J449 Chronic obstructive pulmonary disease, unspecified: Secondary | ICD-10-CM | POA: Insufficient documentation

## 2013-07-21 DIAGNOSIS — M542 Cervicalgia: Secondary | ICD-10-CM | POA: Insufficient documentation

## 2013-07-21 DIAGNOSIS — S82409A Unspecified fracture of shaft of unspecified fibula, initial encounter for closed fracture: Secondary | ICD-10-CM | POA: Insufficient documentation

## 2013-07-21 DIAGNOSIS — Y92410 Unspecified street and highway as the place of occurrence of the external cause: Secondary | ICD-10-CM | POA: Insufficient documentation

## 2013-07-21 DIAGNOSIS — J4489 Other specified chronic obstructive pulmonary disease: Secondary | ICD-10-CM | POA: Insufficient documentation

## 2013-07-21 DIAGNOSIS — Y9355 Activity, bike riding: Secondary | ICD-10-CM | POA: Insufficient documentation

## 2013-07-21 DIAGNOSIS — S4980XA Other specified injuries of shoulder and upper arm, unspecified arm, initial encounter: Secondary | ICD-10-CM | POA: Insufficient documentation

## 2013-07-21 DIAGNOSIS — S46909A Unspecified injury of unspecified muscle, fascia and tendon at shoulder and upper arm level, unspecified arm, initial encounter: Secondary | ICD-10-CM | POA: Insufficient documentation

## 2013-07-21 DIAGNOSIS — S82401A Unspecified fracture of shaft of right fibula, initial encounter for closed fracture: Secondary | ICD-10-CM

## 2013-07-21 DIAGNOSIS — I10 Essential (primary) hypertension: Secondary | ICD-10-CM | POA: Insufficient documentation

## 2013-07-21 DIAGNOSIS — W050XXA Fall from non-moving wheelchair, initial encounter: Secondary | ICD-10-CM | POA: Insufficient documentation

## 2013-07-21 HISTORY — DX: Essential (primary) hypertension: I10

## 2013-07-21 HISTORY — DX: Chronic obstructive pulmonary disease, unspecified: J44.9

## 2013-07-21 MED ORDER — HYDROCODONE-ACETAMINOPHEN 5-325 MG PO TABS
1.0000 | ORAL_TABLET | Freq: Once | ORAL | Status: AC
  Administered 2013-07-21: 1 via ORAL
  Filled 2013-07-21: qty 1

## 2013-07-21 MED ORDER — HYDROCODONE-ACETAMINOPHEN 5-325 MG PO TABS: 1.0000 | ORAL_TABLET | Freq: Four times a day (QID) | ORAL | Status: AC | PRN

## 2013-07-21 NOTE — Discharge Instructions (Signed)
Cast or Splint Care Casts and splints support injured limbs and keep bones from moving while they heal. It is important to care for your cast or splint at home.  HOME CARE INSTRUCTIONS  Keep the cast or splint uncovered during the drying period. It can take 24 to 48 hours to dry if it is made of plaster. A fiberglass cast will dry in less than 1 hour.  Do not rest the cast on anything harder than a pillow for the first 24 hours.  Do not put weight on your injured limb or apply pressure to the cast until your caregiver gives you permission.  Keep the cast or splint dry. Wet casts or splints can lose their shape and may not support the limb as well. Also, wet skin can become infected. Cover the cast or splint with a plastic bag when bathing or when out in the rain or snow. If the cast is on the trunk of the body, take sponge baths until the cast is removed.  Keep your cast or splint clean. Soiled casts may be wiped with a moistened cloth.  Do not place any foreign objects under your cast or splint. Do not try to scratch the skin under the cast with any object. The object could get stuck inside the cast. Also, scratching could lead to an infection.  Do not remove padding from inside your cast.  Exercise all joints next to the injury that are not immobilized by the cast or splint. For example, if you have a long leg cast, exercise the hip joint and toes. If you have an arm cast or splint, exercise the shoulder, elbow, thumb, and fingers.  Elevate your injured arm or leg on 1 or 2 pillows for the first 1 to 3 days to decrease swelling and pain.It is best if you can comfortably elevate your cast so it is higher than your heart. SEEK MEDICAL CARE IF:   Your cast or splint cracks.  Your cast or splint is too tight or too loose.  You experience unbearable itching inside the cast.  Your cast becomes wet or develops a soft spot or area.  You have a bad smell coming from inside your cast.  You  get an object stuck under your cast.  Your skin around the cast becomes red or raw.  You develop a new pain or worsening pain after the cast has been applied. SEEK IMMEDIATE MEDICAL CARE IF:   You have fluid leaking through the cast.  You are unable to move your fingers or toes.  You have discolored, cool, painful, or very swollen fingers or toes beyond the cast.  You have tingling or numbness around the injured area.  You have severe pain or pressure under the cast.  You develop any difficulty with your breathing or have shortness of breath.  You develop chest pain. Document Released: 07/04/2000 Document Revised: 09/29/2011 Document Reviewed: 01/13/2013 [facility] Patient Information 2014 [place], [place].  If you were given medicines take as directed.  If you are on coumadin or contraceptives realize their levels and effectiveness is altered by many different medicines.  If you have any reaction (rash, tongues swelling, other) to the medicines stop taking and see a physician.   Please follow up as directed and return to the ER or see a physician for new or worsening symptoms.  Thank you. For severe pain take norco or vicodin however realize they have the potential for addiction and it can make you sleepy and has  tylenol in it.  No operating machinery while taking.
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
## 2013-07-27 ENCOUNTER — Ambulatory Visit: Payer: Medicare Other | Admitting: Orthopedic Surgery

## 2013-07-28 ENCOUNTER — Ambulatory Visit (INDEPENDENT_AMBULATORY_CARE_PROVIDER_SITE_OTHER): Payer: Medicare Other | Admitting: Orthopedic Surgery

## 2013-07-28 VITALS — BP 168/72 | Ht 68.0 in | Wt 130.0 lb

## 2013-07-28 DIAGNOSIS — S82401A Unspecified fracture of shaft of right fibula, initial encounter for closed fracture: Secondary | ICD-10-CM

## 2013-07-28 DIAGNOSIS — S82409A Unspecified fracture of shaft of unspecified fibula, initial encounter for closed fracture: Secondary | ICD-10-CM | POA: Insufficient documentation

## 2013-07-28 NOTE — Progress Notes (Signed)
Patient ID: Linward Natalhillip R Snellings, male   DOB: Sep 20, 1931, 80 y.o.   MRN: [id number] Pain right foot and ankle  Injury date January 1  Mechanism fall  Sharp pain, 6/10. Constant. Swelling. Weightbearing difficulty. Review of systems negative. Medical problems hypertension previous surgery eyes. Medications none family history negative social history widowed does not smoke or drink  BP 168/72  Ht 5\' 8"  (1.727 m)  Wt 130 lb (58.968 kg)  BMI 19.77 kg/m2 The patient's overall general appearance is normal is oriented x3 his mood and affect are normal he is ambulating with a set of crutches. He has tenderness on the medial lateral aspect of the ankle laterally lateral malleolus is tender there is bruising and ecchymosis of the skin painful but normal passive range of motion. Ankle is stable. Muscle tone is normal. Skin is ecchymotic but intact. Pulses are normal sensation is normal in the foot  Is no lymphangitis  X-rays show nondisplaced fibular fracture with an intact mortise  Patient placed in a Cam Walker. He can start weightbearing on Monday  Followup 6 weeks x-ray ankle  Fracture care plus evaluation and management

## 2013-07-28 NOTE — Patient Instructions (Signed)
Starting Monday start putting weight on foot

## 2013-08-10 ENCOUNTER — Telehealth: Payer: Self-pay | Admitting: Orthopedic Surgery

## 2013-08-10 NOTE — Telephone Encounter (Signed)
Advised patient to continue to wear boot. I advised him to keep a sock on to protect his skin ,and try a thinner sock if he gets too hot with the sock that we gave him.

## 2013-08-10 NOTE — Telephone Encounter (Signed)
Please call Artist Beachhillip Feldstein at [phone], he says the boot and sock are itching and burning real bad.  Asked if and when he can remove the sock? He is not scheduled to come back to see Dr. Romeo AppleHarrison until 09/08/13

## 2013-08-11 ENCOUNTER — Telehealth: Payer: Self-pay | Admitting: Orthopedic Surgery

## 2013-08-11 NOTE — Telephone Encounter (Signed)
Advised patient he can weight bear with the boot on.

## 2013-08-11 NOTE — Telephone Encounter (Signed)
Mr. Tyler Watkins wants to know he can put weight on his foot and can he walk with the boot on.

## 2013-09-08 ENCOUNTER — Ambulatory Visit: Payer: Medicare Other | Admitting: Orthopedic Surgery

## 2013-09-26 ENCOUNTER — Ambulatory Visit: Payer: Medicare Other

## 2013-09-26 ENCOUNTER — Ambulatory Visit (INDEPENDENT_AMBULATORY_CARE_PROVIDER_SITE_OTHER): Payer: Medicare Other | Admitting: Orthopedic Surgery

## 2013-09-26 DIAGNOSIS — S82899A Other fracture of unspecified lower leg, initial encounter for closed fracture: Secondary | ICD-10-CM

## 2013-09-27 ENCOUNTER — Ambulatory Visit (INDEPENDENT_AMBULATORY_CARE_PROVIDER_SITE_OTHER): Payer: Self-pay | Admitting: Orthopedic Surgery

## 2013-09-27 ENCOUNTER — Ambulatory Visit (INDEPENDENT_AMBULATORY_CARE_PROVIDER_SITE_OTHER): Payer: Medicare Other

## 2013-09-27 ENCOUNTER — Encounter: Payer: Self-pay | Admitting: Orthopedic Surgery

## 2013-09-27 VITALS — BP 159/77 | Ht 68.0 in | Wt 130.0 lb

## 2013-09-27 DIAGNOSIS — S82891A Other fracture of right lower leg, initial encounter for closed fracture: Secondary | ICD-10-CM

## 2013-09-27 DIAGNOSIS — S82899A Other fracture of unspecified lower leg, initial encounter for closed fracture: Secondary | ICD-10-CM

## 2013-09-27 NOTE — Progress Notes (Signed)
Patient ID: Tyler Watkins, male   DOB: 02/19/1932, 80 y.o.   MRN: [id number]  Chief Complaint  Patient presents with  . Follow-up    6 week follow up right ankle fracture DOI 07/21/13    HISTORY: X-rays right ankle  Right ankle fracture  X-ray show fracture healing  Patient asymptomatic. Patient discharged

## 2013-09-27 NOTE — Patient Instructions (Signed)
Remove brace  Activity as tolerated 

## 2013-10-10 NOTE — Progress Notes (Signed)
This chart note came to me - routing back to Dr Romeo AppleHarrison

## 2014-07-24 DIAGNOSIS — R1310 Dysphagia, unspecified: Secondary | ICD-10-CM | POA: Diagnosis not present

## 2014-07-28 DIAGNOSIS — K2970 Gastritis, unspecified, without bleeding: Secondary | ICD-10-CM | POA: Diagnosis not present

## 2014-07-28 DIAGNOSIS — R1310 Dysphagia, unspecified: Secondary | ICD-10-CM | POA: Diagnosis not present

## 2014-07-28 DIAGNOSIS — Q394 Esophageal web: Secondary | ICD-10-CM | POA: Diagnosis not present

## 2014-07-28 DIAGNOSIS — Z955 Presence of coronary angioplasty implant and graft: Secondary | ICD-10-CM | POA: Diagnosis not present

## 2014-07-28 DIAGNOSIS — F419 Anxiety disorder, unspecified: Secondary | ICD-10-CM | POA: Diagnosis not present

## 2014-07-28 DIAGNOSIS — K222 Esophageal obstruction: Secondary | ICD-10-CM | POA: Diagnosis not present

## 2014-07-28 DIAGNOSIS — Z79899 Other long term (current) drug therapy: Secondary | ICD-10-CM | POA: Diagnosis not present

## 2014-07-28 DIAGNOSIS — I10 Essential (primary) hypertension: Secondary | ICD-10-CM | POA: Diagnosis not present

## 2014-07-28 DIAGNOSIS — I2510 Atherosclerotic heart disease of native coronary artery without angina pectoris: Secondary | ICD-10-CM | POA: Diagnosis not present

## 2014-07-28 DIAGNOSIS — J449 Chronic obstructive pulmonary disease, unspecified: Secondary | ICD-10-CM | POA: Diagnosis not present

## 2014-08-17 DIAGNOSIS — R1310 Dysphagia, unspecified: Secondary | ICD-10-CM | POA: Diagnosis not present

## 2014-10-25 DIAGNOSIS — F411 Generalized anxiety disorder: Secondary | ICD-10-CM | POA: Diagnosis not present

## 2014-10-25 DIAGNOSIS — I252 Old myocardial infarction: Secondary | ICD-10-CM | POA: Diagnosis not present

## 2014-10-25 DIAGNOSIS — K219 Gastro-esophageal reflux disease without esophagitis: Secondary | ICD-10-CM | POA: Diagnosis not present

## 2014-10-25 DIAGNOSIS — I10 Essential (primary) hypertension: Secondary | ICD-10-CM | POA: Diagnosis not present

## 2014-10-25 DIAGNOSIS — E780 Pure hypercholesterolemia: Secondary | ICD-10-CM | POA: Diagnosis not present

## 2014-12-02 DIAGNOSIS — M7021 Olecranon bursitis, right elbow: Secondary | ICD-10-CM | POA: Diagnosis not present

## 2014-12-02 DIAGNOSIS — K219 Gastro-esophageal reflux disease without esophagitis: Secondary | ICD-10-CM | POA: Diagnosis not present

## 2015-06-22 DIAGNOSIS — R001 Bradycardia, unspecified: Secondary | ICD-10-CM | POA: Diagnosis not present

## 2015-06-22 DIAGNOSIS — I10 Essential (primary) hypertension: Secondary | ICD-10-CM | POA: Diagnosis not present

## 2015-06-22 DIAGNOSIS — Z7982 Long term (current) use of aspirin: Secondary | ICD-10-CM | POA: Diagnosis not present

## 2015-06-22 DIAGNOSIS — I2510 Atherosclerotic heart disease of native coronary artery without angina pectoris: Secondary | ICD-10-CM | POA: Diagnosis not present

## 2015-06-22 DIAGNOSIS — Z7951 Long term (current) use of inhaled steroids: Secondary | ICD-10-CM | POA: Diagnosis not present

## 2015-06-22 DIAGNOSIS — R81 Glycosuria: Secondary | ICD-10-CM | POA: Diagnosis not present

## 2015-06-22 DIAGNOSIS — R404 Transient alteration of awareness: Secondary | ICD-10-CM | POA: Diagnosis not present

## 2015-06-22 DIAGNOSIS — R4182 Altered mental status, unspecified: Secondary | ICD-10-CM | POA: Diagnosis not present

## 2015-06-22 DIAGNOSIS — Z8249 Family history of ischemic heart disease and other diseases of the circulatory system: Secondary | ICD-10-CM | POA: Diagnosis not present

## 2015-06-22 DIAGNOSIS — Z79899 Other long term (current) drug therapy: Secondary | ICD-10-CM | POA: Diagnosis not present

## 2015-06-22 DIAGNOSIS — Z23 Encounter for immunization: Secondary | ICD-10-CM | POA: Diagnosis not present

## 2015-06-22 DIAGNOSIS — Z955 Presence of coronary angioplasty implant and graft: Secondary | ICD-10-CM | POA: Diagnosis not present

## 2015-06-22 DIAGNOSIS — R402431 Glasgow coma scale score 3-8, in the field [EMT or ambulance]: Secondary | ICD-10-CM | POA: Diagnosis not present

## 2015-06-22 DIAGNOSIS — R55 Syncope and collapse: Secondary | ICD-10-CM | POA: Diagnosis not present

## 2015-06-22 DIAGNOSIS — E0965 Drug or chemical induced diabetes mellitus with hyperglycemia: Secondary | ICD-10-CM | POA: Diagnosis not present

## 2015-06-22 DIAGNOSIS — Z9181 History of falling: Secondary | ICD-10-CM | POA: Diagnosis not present

## 2015-06-22 DIAGNOSIS — R5383 Other fatigue: Secondary | ICD-10-CM | POA: Diagnosis not present

## 2015-06-22 DIAGNOSIS — Z836 Family history of other diseases of the respiratory system: Secondary | ICD-10-CM | POA: Diagnosis not present

## 2015-06-23 DIAGNOSIS — R4182 Altered mental status, unspecified: Secondary | ICD-10-CM | POA: Diagnosis not present

## 2015-07-23 DIAGNOSIS — R1111 Vomiting without nausea: Secondary | ICD-10-CM | POA: Diagnosis not present

## 2015-09-17 DIAGNOSIS — F411 Generalized anxiety disorder: Secondary | ICD-10-CM | POA: Diagnosis not present

## 2015-09-17 DIAGNOSIS — R634 Abnormal weight loss: Secondary | ICD-10-CM | POA: Diagnosis not present

## 2015-09-17 DIAGNOSIS — I10 Essential (primary) hypertension: Secondary | ICD-10-CM | POA: Diagnosis not present

## 2015-11-27 DIAGNOSIS — R4702 Dysphasia: Secondary | ICD-10-CM | POA: Diagnosis not present

## 2015-11-27 DIAGNOSIS — G459 Transient cerebral ischemic attack, unspecified: Secondary | ICD-10-CM | POA: Diagnosis not present

## 2015-11-27 DIAGNOSIS — I10 Essential (primary) hypertension: Secondary | ICD-10-CM | POA: Diagnosis not present

## 2015-11-27 DIAGNOSIS — R410 Disorientation, unspecified: Secondary | ICD-10-CM | POA: Diagnosis not present

## 2015-11-29 DIAGNOSIS — I10 Essential (primary) hypertension: Secondary | ICD-10-CM | POA: Diagnosis not present

## 2015-11-29 DIAGNOSIS — I672 Cerebral atherosclerosis: Secondary | ICD-10-CM | POA: Diagnosis not present

## 2015-11-29 DIAGNOSIS — R4702 Dysphasia: Secondary | ICD-10-CM | POA: Diagnosis not present

## 2015-11-29 DIAGNOSIS — R410 Disorientation, unspecified: Secondary | ICD-10-CM | POA: Diagnosis not present

## 2015-11-29 DIAGNOSIS — G459 Transient cerebral ischemic attack, unspecified: Secondary | ICD-10-CM | POA: Diagnosis not present

## 2016-08-16 DIAGNOSIS — Z7982 Long term (current) use of aspirin: Secondary | ICD-10-CM | POA: Diagnosis not present

## 2016-08-16 DIAGNOSIS — I2510 Atherosclerotic heart disease of native coronary artery without angina pectoris: Secondary | ICD-10-CM | POA: Diagnosis not present

## 2016-08-16 DIAGNOSIS — R404 Transient alteration of awareness: Secondary | ICD-10-CM | POA: Diagnosis not present

## 2016-08-16 DIAGNOSIS — E162 Hypoglycemia, unspecified: Secondary | ICD-10-CM | POA: Diagnosis not present

## 2016-08-16 DIAGNOSIS — J449 Chronic obstructive pulmonary disease, unspecified: Secondary | ICD-10-CM | POA: Diagnosis not present

## 2016-08-16 DIAGNOSIS — R410 Disorientation, unspecified: Secondary | ICD-10-CM | POA: Diagnosis not present

## 2016-08-16 DIAGNOSIS — I161 Hypertensive emergency: Secondary | ICD-10-CM | POA: Diagnosis not present

## 2016-08-16 DIAGNOSIS — I482 Chronic atrial fibrillation: Secondary | ICD-10-CM | POA: Diagnosis not present

## 2016-08-16 DIAGNOSIS — Z87891 Personal history of nicotine dependence: Secondary | ICD-10-CM | POA: Diagnosis not present

## 2016-08-16 DIAGNOSIS — I10 Essential (primary) hypertension: Secondary | ICD-10-CM | POA: Diagnosis not present

## 2016-08-16 DIAGNOSIS — I6789 Other cerebrovascular disease: Secondary | ICD-10-CM | POA: Diagnosis not present

## 2016-08-16 DIAGNOSIS — Z955 Presence of coronary angioplasty implant and graft: Secondary | ICD-10-CM | POA: Diagnosis not present

## 2016-08-16 DIAGNOSIS — Z79899 Other long term (current) drug therapy: Secondary | ICD-10-CM | POA: Diagnosis not present

## 2016-08-17 DIAGNOSIS — I161 Hypertensive emergency: Secondary | ICD-10-CM | POA: Diagnosis not present

## 2016-08-18 DIAGNOSIS — I169 Hypertensive crisis, unspecified: Secondary | ICD-10-CM | POA: Diagnosis not present

## 2016-08-27 DIAGNOSIS — I10 Essential (primary) hypertension: Secondary | ICD-10-CM | POA: Diagnosis not present

## 2016-08-27 DIAGNOSIS — R410 Disorientation, unspecified: Secondary | ICD-10-CM | POA: Diagnosis not present

## 2017-10-16 DIAGNOSIS — R05 Cough: Secondary | ICD-10-CM | POA: Diagnosis not present

## 2017-10-16 DIAGNOSIS — I10 Essential (primary) hypertension: Secondary | ICD-10-CM | POA: Diagnosis not present

## 2017-10-16 DIAGNOSIS — J069 Acute upper respiratory infection, unspecified: Secondary | ICD-10-CM | POA: Diagnosis not present

## 2017-11-11 DIAGNOSIS — M542 Cervicalgia: Secondary | ICD-10-CM | POA: Diagnosis not present

## 2017-11-11 DIAGNOSIS — G8929 Other chronic pain: Secondary | ICD-10-CM | POA: Diagnosis not present

## 2017-11-11 DIAGNOSIS — Z79899 Other long term (current) drug therapy: Secondary | ICD-10-CM | POA: Diagnosis not present

## 2017-11-11 DIAGNOSIS — M546 Pain in thoracic spine: Secondary | ICD-10-CM | POA: Diagnosis not present

## 2017-11-11 DIAGNOSIS — E7800 Pure hypercholesterolemia, unspecified: Secondary | ICD-10-CM | POA: Diagnosis not present

## 2017-11-11 DIAGNOSIS — Z7982 Long term (current) use of aspirin: Secondary | ICD-10-CM | POA: Diagnosis not present

## 2017-11-11 DIAGNOSIS — I119 Hypertensive heart disease without heart failure: Secondary | ICD-10-CM | POA: Diagnosis not present

## 2017-11-11 DIAGNOSIS — M25519 Pain in unspecified shoulder: Secondary | ICD-10-CM | POA: Diagnosis not present

## 2017-11-11 DIAGNOSIS — J449 Chronic obstructive pulmonary disease, unspecified: Secondary | ICD-10-CM | POA: Diagnosis not present

## 2017-11-11 DIAGNOSIS — M545 Low back pain: Secondary | ICD-10-CM | POA: Diagnosis not present

## 2018-02-03 ENCOUNTER — Other Ambulatory Visit: Payer: Self-pay

## 2018-02-03 NOTE — Patient Outreach (Signed)
[facility] Care Management  02/03/2018  Linward Natalhillip R Cribb 1932/06/23 [id number]   Medication Adherence call to Mr. Artist Beachhillip Devaux patient's telephone number is disconnected, Epic has a different number [phone] left a message for patient to call back, patient is due on lisinopril 10 mg.Mr.Parmenter is showing past due under [facility] [employer].  Lillia AbedAna Ollison-Moran CPhT Pharmacy Technician [facility] Care Management Direct Dial [phone]  Fax [phone] [email or web address]

## 2018-02-09 DIAGNOSIS — H6123 Impacted cerumen, bilateral: Secondary | ICD-10-CM | POA: Diagnosis not present

## 2018-04-29 DIAGNOSIS — Z23 Encounter for immunization: Secondary | ICD-10-CM | POA: Diagnosis not present

## 2018-04-29 DIAGNOSIS — Z6820 Body mass index (BMI) 20.0-20.9, adult: Secondary | ICD-10-CM | POA: Diagnosis not present

## 2018-04-29 DIAGNOSIS — I10 Essential (primary) hypertension: Secondary | ICD-10-CM | POA: Diagnosis not present

## 2018-04-29 DIAGNOSIS — Z0001 Encounter for general adult medical examination with abnormal findings: Secondary | ICD-10-CM | POA: Diagnosis not present

## 2018-05-13 DIAGNOSIS — R5383 Other fatigue: Secondary | ICD-10-CM | POA: Diagnosis not present

## 2018-05-13 DIAGNOSIS — R531 Weakness: Secondary | ICD-10-CM | POA: Diagnosis not present

## 2018-05-13 DIAGNOSIS — I10 Essential (primary) hypertension: Secondary | ICD-10-CM | POA: Diagnosis not present

## 2018-05-13 DIAGNOSIS — Z6820 Body mass index (BMI) 20.0-20.9, adult: Secondary | ICD-10-CM | POA: Diagnosis not present

## 2018-05-13 DIAGNOSIS — R634 Abnormal weight loss: Secondary | ICD-10-CM | POA: Diagnosis not present

## 2018-11-18 DIAGNOSIS — H9190 Unspecified hearing loss, unspecified ear: Secondary | ICD-10-CM | POA: Diagnosis not present

## 2018-11-18 DIAGNOSIS — I639 Cerebral infarction, unspecified: Secondary | ICD-10-CM | POA: Diagnosis not present

## 2018-11-18 DIAGNOSIS — Z87891 Personal history of nicotine dependence: Secondary | ICD-10-CM | POA: Diagnosis not present

## 2018-11-18 DIAGNOSIS — R531 Weakness: Secondary | ICD-10-CM | POA: Diagnosis not present

## 2018-11-18 DIAGNOSIS — I10 Essential (primary) hypertension: Secondary | ICD-10-CM | POA: Diagnosis not present

## 2018-11-18 DIAGNOSIS — R29700 NIHSS score 0: Secondary | ICD-10-CM | POA: Diagnosis not present

## 2018-11-18 DIAGNOSIS — J449 Chronic obstructive pulmonary disease, unspecified: Secondary | ICD-10-CM | POA: Diagnosis present

## 2018-11-18 DIAGNOSIS — I6389 Other cerebral infarction: Secondary | ICD-10-CM | POA: Diagnosis not present

## 2018-11-22 DIAGNOSIS — J449 Chronic obstructive pulmonary disease, unspecified: Secondary | ICD-10-CM | POA: Diagnosis not present

## 2018-11-22 DIAGNOSIS — I10 Essential (primary) hypertension: Secondary | ICD-10-CM | POA: Diagnosis not present

## 2018-11-22 DIAGNOSIS — I2510 Atherosclerotic heart disease of native coronary artery without angina pectoris: Secondary | ICD-10-CM | POA: Diagnosis not present

## 2018-11-22 DIAGNOSIS — Z8673 Personal history of transient ischemic attack (TIA), and cerebral infarction without residual deficits: Secondary | ICD-10-CM | POA: Diagnosis not present

## 2018-11-22 DIAGNOSIS — E785 Hyperlipidemia, unspecified: Secondary | ICD-10-CM | POA: Diagnosis not present

## 2018-11-24 DIAGNOSIS — I10 Essential (primary) hypertension: Secondary | ICD-10-CM | POA: Diagnosis not present

## 2018-11-24 DIAGNOSIS — I2510 Atherosclerotic heart disease of native coronary artery without angina pectoris: Secondary | ICD-10-CM | POA: Diagnosis not present

## 2018-11-24 DIAGNOSIS — J449 Chronic obstructive pulmonary disease, unspecified: Secondary | ICD-10-CM | POA: Diagnosis not present

## 2018-11-24 DIAGNOSIS — Z8673 Personal history of transient ischemic attack (TIA), and cerebral infarction without residual deficits: Secondary | ICD-10-CM | POA: Diagnosis not present

## 2018-11-24 DIAGNOSIS — E785 Hyperlipidemia, unspecified: Secondary | ICD-10-CM | POA: Diagnosis not present

## 2018-11-26 DIAGNOSIS — E785 Hyperlipidemia, unspecified: Secondary | ICD-10-CM | POA: Diagnosis not present

## 2018-11-26 DIAGNOSIS — I2510 Atherosclerotic heart disease of native coronary artery without angina pectoris: Secondary | ICD-10-CM | POA: Diagnosis not present

## 2018-11-26 DIAGNOSIS — I10 Essential (primary) hypertension: Secondary | ICD-10-CM | POA: Diagnosis not present

## 2018-11-26 DIAGNOSIS — J449 Chronic obstructive pulmonary disease, unspecified: Secondary | ICD-10-CM | POA: Diagnosis not present

## 2018-11-26 DIAGNOSIS — Z8673 Personal history of transient ischemic attack (TIA), and cerebral infarction without residual deficits: Secondary | ICD-10-CM | POA: Diagnosis not present

## 2018-11-29 DIAGNOSIS — J449 Chronic obstructive pulmonary disease, unspecified: Secondary | ICD-10-CM | POA: Diagnosis not present

## 2018-11-29 DIAGNOSIS — E785 Hyperlipidemia, unspecified: Secondary | ICD-10-CM | POA: Diagnosis not present

## 2018-11-29 DIAGNOSIS — I10 Essential (primary) hypertension: Secondary | ICD-10-CM | POA: Diagnosis not present

## 2018-11-29 DIAGNOSIS — Z8673 Personal history of transient ischemic attack (TIA), and cerebral infarction without residual deficits: Secondary | ICD-10-CM | POA: Diagnosis not present

## 2018-11-29 DIAGNOSIS — I2510 Atherosclerotic heart disease of native coronary artery without angina pectoris: Secondary | ICD-10-CM | POA: Diagnosis not present

## 2018-11-30 DIAGNOSIS — J449 Chronic obstructive pulmonary disease, unspecified: Secondary | ICD-10-CM | POA: Diagnosis not present

## 2018-11-30 DIAGNOSIS — I10 Essential (primary) hypertension: Secondary | ICD-10-CM | POA: Diagnosis not present

## 2018-11-30 DIAGNOSIS — E785 Hyperlipidemia, unspecified: Secondary | ICD-10-CM | POA: Diagnosis not present

## 2018-11-30 DIAGNOSIS — I2510 Atherosclerotic heart disease of native coronary artery without angina pectoris: Secondary | ICD-10-CM | POA: Diagnosis not present

## 2018-11-30 DIAGNOSIS — Z8673 Personal history of transient ischemic attack (TIA), and cerebral infarction without residual deficits: Secondary | ICD-10-CM | POA: Diagnosis not present

## 2018-12-01 DIAGNOSIS — I10 Essential (primary) hypertension: Secondary | ICD-10-CM | POA: Diagnosis not present

## 2018-12-01 DIAGNOSIS — Z8673 Personal history of transient ischemic attack (TIA), and cerebral infarction without residual deficits: Secondary | ICD-10-CM | POA: Diagnosis not present

## 2018-12-01 DIAGNOSIS — I2510 Atherosclerotic heart disease of native coronary artery without angina pectoris: Secondary | ICD-10-CM | POA: Diagnosis not present

## 2018-12-01 DIAGNOSIS — J449 Chronic obstructive pulmonary disease, unspecified: Secondary | ICD-10-CM | POA: Diagnosis not present

## 2018-12-01 DIAGNOSIS — E785 Hyperlipidemia, unspecified: Secondary | ICD-10-CM | POA: Diagnosis not present

## 2018-12-03 DIAGNOSIS — Z8673 Personal history of transient ischemic attack (TIA), and cerebral infarction without residual deficits: Secondary | ICD-10-CM | POA: Diagnosis not present

## 2018-12-03 DIAGNOSIS — J449 Chronic obstructive pulmonary disease, unspecified: Secondary | ICD-10-CM | POA: Diagnosis not present

## 2018-12-03 DIAGNOSIS — I2510 Atherosclerotic heart disease of native coronary artery without angina pectoris: Secondary | ICD-10-CM | POA: Diagnosis not present

## 2018-12-03 DIAGNOSIS — E785 Hyperlipidemia, unspecified: Secondary | ICD-10-CM | POA: Diagnosis not present

## 2018-12-03 DIAGNOSIS — I10 Essential (primary) hypertension: Secondary | ICD-10-CM | POA: Diagnosis not present

## 2018-12-07 DIAGNOSIS — J449 Chronic obstructive pulmonary disease, unspecified: Secondary | ICD-10-CM | POA: Diagnosis not present

## 2018-12-07 DIAGNOSIS — E785 Hyperlipidemia, unspecified: Secondary | ICD-10-CM | POA: Diagnosis not present

## 2018-12-07 DIAGNOSIS — I10 Essential (primary) hypertension: Secondary | ICD-10-CM | POA: Diagnosis not present

## 2018-12-07 DIAGNOSIS — Z8673 Personal history of transient ischemic attack (TIA), and cerebral infarction without residual deficits: Secondary | ICD-10-CM | POA: Diagnosis not present

## 2018-12-07 DIAGNOSIS — I2510 Atherosclerotic heart disease of native coronary artery without angina pectoris: Secondary | ICD-10-CM | POA: Diagnosis not present

## 2018-12-09 DIAGNOSIS — I10 Essential (primary) hypertension: Secondary | ICD-10-CM | POA: Diagnosis not present

## 2018-12-09 DIAGNOSIS — Z8673 Personal history of transient ischemic attack (TIA), and cerebral infarction without residual deficits: Secondary | ICD-10-CM | POA: Diagnosis not present

## 2018-12-09 DIAGNOSIS — E785 Hyperlipidemia, unspecified: Secondary | ICD-10-CM | POA: Diagnosis not present

## 2018-12-09 DIAGNOSIS — J449 Chronic obstructive pulmonary disease, unspecified: Secondary | ICD-10-CM | POA: Diagnosis not present

## 2018-12-09 DIAGNOSIS — I2510 Atherosclerotic heart disease of native coronary artery without angina pectoris: Secondary | ICD-10-CM | POA: Diagnosis not present

## 2018-12-21 DIAGNOSIS — E785 Hyperlipidemia, unspecified: Secondary | ICD-10-CM | POA: Diagnosis not present

## 2018-12-21 DIAGNOSIS — Z8673 Personal history of transient ischemic attack (TIA), and cerebral infarction without residual deficits: Secondary | ICD-10-CM | POA: Diagnosis not present

## 2018-12-21 DIAGNOSIS — I2510 Atherosclerotic heart disease of native coronary artery without angina pectoris: Secondary | ICD-10-CM | POA: Diagnosis not present

## 2018-12-21 DIAGNOSIS — I10 Essential (primary) hypertension: Secondary | ICD-10-CM | POA: Diagnosis not present

## 2018-12-21 DIAGNOSIS — J449 Chronic obstructive pulmonary disease, unspecified: Secondary | ICD-10-CM | POA: Diagnosis not present

## 2018-12-27 DIAGNOSIS — I10 Essential (primary) hypertension: Secondary | ICD-10-CM | POA: Diagnosis not present

## 2018-12-27 DIAGNOSIS — J690 Pneumonitis due to inhalation of food and vomit: Secondary | ICD-10-CM | POA: Diagnosis not present

## 2018-12-27 DIAGNOSIS — R531 Weakness: Secondary | ICD-10-CM | POA: Diagnosis not present

## 2018-12-27 DIAGNOSIS — I63449 Cerebral infarction due to embolism of unspecified cerebellar artery: Secondary | ICD-10-CM | POA: Diagnosis not present

## 2018-12-27 DIAGNOSIS — Z87891 Personal history of nicotine dependence: Secondary | ICD-10-CM | POA: Diagnosis not present

## 2018-12-27 DIAGNOSIS — J8410 Pulmonary fibrosis, unspecified: Secondary | ICD-10-CM | POA: Diagnosis not present

## 2018-12-27 DIAGNOSIS — Z20828 Contact with and (suspected) exposure to other viral communicable diseases: Secondary | ICD-10-CM | POA: Diagnosis not present

## 2018-12-27 DIAGNOSIS — R4701 Aphasia: Secondary | ICD-10-CM | POA: Diagnosis not present

## 2018-12-27 DIAGNOSIS — J189 Pneumonia, unspecified organism: Secondary | ICD-10-CM | POA: Diagnosis not present

## 2018-12-28 ENCOUNTER — Other Ambulatory Visit (HOSPITAL_COMMUNITY): Payer: Self-pay | Admitting: Emergency Medicine

## 2018-12-28 ENCOUNTER — Ambulatory Visit (HOSPITAL_COMMUNITY)
Admission: RE | Admit: 2018-12-28 | Discharge: 2018-12-28 | Disposition: A | Discharge status: Home / Self Care | Payer: Medicare Other | Source: Ambulatory Visit | Attending: Emergency Medicine | Admitting: Emergency Medicine

## 2018-12-28 ENCOUNTER — Other Ambulatory Visit: Payer: Self-pay

## 2018-12-28 DIAGNOSIS — I358 Other nonrheumatic aortic valve disorders: Secondary | ICD-10-CM | POA: Diagnosis not present

## 2018-12-28 DIAGNOSIS — H9193 Unspecified hearing loss, bilateral: Secondary | ICD-10-CM | POA: Diagnosis not present

## 2018-12-28 DIAGNOSIS — I639 Cerebral infarction, unspecified: Secondary | ICD-10-CM | POA: Diagnosis not present

## 2018-12-28 DIAGNOSIS — J449 Chronic obstructive pulmonary disease, unspecified: Secondary | ICD-10-CM | POA: Diagnosis not present

## 2018-12-28 DIAGNOSIS — Z20828 Contact with and (suspected) exposure to other viral communicable diseases: Secondary | ICD-10-CM | POA: Diagnosis present

## 2018-12-28 DIAGNOSIS — I63449 Cerebral infarction due to embolism of unspecified cerebellar artery: Secondary | ICD-10-CM | POA: Diagnosis not present

## 2018-12-28 DIAGNOSIS — R2689 Other abnormalities of gait and mobility: Secondary | ICD-10-CM | POA: Diagnosis not present

## 2018-12-28 DIAGNOSIS — R42 Dizziness and giddiness: Secondary | ICD-10-CM

## 2018-12-28 DIAGNOSIS — Z7401 Bed confinement status: Secondary | ICD-10-CM | POA: Diagnosis not present

## 2018-12-28 DIAGNOSIS — M6281 Muscle weakness (generalized): Secondary | ICD-10-CM | POA: Diagnosis not present

## 2018-12-28 DIAGNOSIS — J181 Lobar pneumonia, unspecified organism: Secondary | ICD-10-CM | POA: Diagnosis not present

## 2018-12-28 DIAGNOSIS — I6782 Cerebral ischemia: Secondary | ICD-10-CM | POA: Diagnosis not present

## 2018-12-28 DIAGNOSIS — R1312 Dysphagia, oropharyngeal phase: Secondary | ICD-10-CM | POA: Diagnosis not present

## 2018-12-28 DIAGNOSIS — Z7982 Long term (current) use of aspirin: Secondary | ICD-10-CM | POA: Diagnosis not present

## 2018-12-28 DIAGNOSIS — R4701 Aphasia: Secondary | ICD-10-CM | POA: Diagnosis present

## 2018-12-28 DIAGNOSIS — E871 Hypo-osmolality and hyponatremia: Secondary | ICD-10-CM | POA: Diagnosis not present

## 2018-12-28 DIAGNOSIS — I10 Essential (primary) hypertension: Secondary | ICD-10-CM | POA: Diagnosis present

## 2018-12-28 DIAGNOSIS — E785 Hyperlipidemia, unspecified: Secondary | ICD-10-CM | POA: Diagnosis not present

## 2018-12-28 DIAGNOSIS — J690 Pneumonitis due to inhalation of food and vomit: Secondary | ICD-10-CM | POA: Diagnosis present

## 2018-12-28 DIAGNOSIS — R4189 Other symptoms and signs involving cognitive functions and awareness: Secondary | ICD-10-CM | POA: Diagnosis not present

## 2018-12-28 DIAGNOSIS — I6312 Cerebral infarction due to embolism of basilar artery: Secondary | ICD-10-CM | POA: Diagnosis not present

## 2018-12-28 DIAGNOSIS — I63233 Cerebral infarction due to unspecified occlusion or stenosis of bilateral carotid arteries: Secondary | ICD-10-CM | POA: Diagnosis not present

## 2018-12-28 DIAGNOSIS — R0902 Hypoxemia: Secondary | ICD-10-CM | POA: Diagnosis not present

## 2018-12-28 DIAGNOSIS — J8410 Pulmonary fibrosis, unspecified: Secondary | ICD-10-CM | POA: Diagnosis not present

## 2018-12-28 DIAGNOSIS — R29898 Other symptoms and signs involving the musculoskeletal system: Secondary | ICD-10-CM | POA: Diagnosis not present

## 2018-12-28 DIAGNOSIS — R41841 Cognitive communication deficit: Secondary | ICD-10-CM | POA: Diagnosis not present

## 2018-12-28 DIAGNOSIS — R531 Weakness: Secondary | ICD-10-CM | POA: Diagnosis present

## 2018-12-28 DIAGNOSIS — E7800 Pure hypercholesterolemia, unspecified: Secondary | ICD-10-CM | POA: Diagnosis present

## 2018-12-28 DIAGNOSIS — Z87891 Personal history of nicotine dependence: Secondary | ICD-10-CM | POA: Diagnosis not present

## 2018-12-28 DIAGNOSIS — J189 Pneumonia, unspecified organism: Secondary | ICD-10-CM | POA: Diagnosis not present

## 2018-12-28 DIAGNOSIS — J988 Other specified respiratory disorders: Secondary | ICD-10-CM | POA: Diagnosis not present

## 2018-12-28 DIAGNOSIS — E876 Hypokalemia: Secondary | ICD-10-CM | POA: Diagnosis not present

## 2018-12-28 DIAGNOSIS — I2510 Atherosclerotic heart disease of native coronary artery without angina pectoris: Secondary | ICD-10-CM | POA: Diagnosis not present

## 2018-12-30 DIAGNOSIS — I358 Other nonrheumatic aortic valve disorders: Secondary | ICD-10-CM | POA: Diagnosis not present

## 2018-12-31 DIAGNOSIS — M6281 Muscle weakness (generalized): Secondary | ICD-10-CM | POA: Diagnosis not present

## 2018-12-31 DIAGNOSIS — R1312 Dysphagia, oropharyngeal phase: Secondary | ICD-10-CM | POA: Diagnosis not present

## 2018-12-31 DIAGNOSIS — J449 Chronic obstructive pulmonary disease, unspecified: Secondary | ICD-10-CM | POA: Diagnosis not present

## 2018-12-31 DIAGNOSIS — R05 Cough: Secondary | ICD-10-CM | POA: Diagnosis not present

## 2018-12-31 DIAGNOSIS — E871 Hypo-osmolality and hyponatremia: Secondary | ICD-10-CM | POA: Diagnosis not present

## 2018-12-31 DIAGNOSIS — R531 Weakness: Secondary | ICD-10-CM | POA: Diagnosis not present

## 2018-12-31 DIAGNOSIS — H9193 Unspecified hearing loss, bilateral: Secondary | ICD-10-CM | POA: Diagnosis not present

## 2018-12-31 DIAGNOSIS — K219 Gastro-esophageal reflux disease without esophagitis: Secondary | ICD-10-CM | POA: Diagnosis not present

## 2018-12-31 DIAGNOSIS — Z20828 Contact with and (suspected) exposure to other viral communicable diseases: Secondary | ICD-10-CM | POA: Diagnosis not present

## 2018-12-31 DIAGNOSIS — E785 Hyperlipidemia, unspecified: Secondary | ICD-10-CM | POA: Diagnosis not present

## 2018-12-31 DIAGNOSIS — R0902 Hypoxemia: Secondary | ICD-10-CM | POA: Diagnosis not present

## 2018-12-31 DIAGNOSIS — R29898 Other symptoms and signs involving the musculoskeletal system: Secondary | ICD-10-CM | POA: Diagnosis not present

## 2018-12-31 DIAGNOSIS — Z7401 Bed confinement status: Secondary | ICD-10-CM | POA: Diagnosis not present

## 2018-12-31 DIAGNOSIS — E876 Hypokalemia: Secondary | ICD-10-CM | POA: Diagnosis not present

## 2018-12-31 DIAGNOSIS — R2689 Other abnormalities of gait and mobility: Secondary | ICD-10-CM | POA: Diagnosis not present

## 2018-12-31 DIAGNOSIS — I639 Cerebral infarction, unspecified: Secondary | ICD-10-CM | POA: Diagnosis not present

## 2018-12-31 DIAGNOSIS — J690 Pneumonitis due to inhalation of food and vomit: Secondary | ICD-10-CM | POA: Diagnosis not present

## 2018-12-31 DIAGNOSIS — J189 Pneumonia, unspecified organism: Secondary | ICD-10-CM | POA: Diagnosis not present

## 2018-12-31 DIAGNOSIS — R6889 Other general symptoms and signs: Secondary | ICD-10-CM | POA: Diagnosis not present

## 2018-12-31 DIAGNOSIS — J181 Lobar pneumonia, unspecified organism: Secondary | ICD-10-CM | POA: Diagnosis not present

## 2018-12-31 DIAGNOSIS — I6312 Cerebral infarction due to embolism of basilar artery: Secondary | ICD-10-CM | POA: Diagnosis not present

## 2018-12-31 DIAGNOSIS — I2510 Atherosclerotic heart disease of native coronary artery without angina pectoris: Secondary | ICD-10-CM | POA: Diagnosis not present

## 2018-12-31 DIAGNOSIS — I10 Essential (primary) hypertension: Secondary | ICD-10-CM | POA: Diagnosis not present

## 2018-12-31 DIAGNOSIS — R41841 Cognitive communication deficit: Secondary | ICD-10-CM | POA: Diagnosis not present

## 2019-01-14 ENCOUNTER — Other Ambulatory Visit (HOSPITAL_COMMUNITY): Payer: Self-pay

## 2019-02-20 DIAGNOSIS — K219 Gastro-esophageal reflux disease without esophagitis: Secondary | ICD-10-CM | POA: Diagnosis not present

## 2019-02-20 DIAGNOSIS — J189 Pneumonia, unspecified organism: Secondary | ICD-10-CM | POA: Diagnosis not present

## 2019-02-20 DIAGNOSIS — E876 Hypokalemia: Secondary | ICD-10-CM | POA: Diagnosis not present

## 2019-03-30 DIAGNOSIS — Z20828 Contact with and (suspected) exposure to other viral communicable diseases: Secondary | ICD-10-CM | POA: Diagnosis not present

## 2019-04-06 DIAGNOSIS — Z20828 Contact with and (suspected) exposure to other viral communicable diseases: Secondary | ICD-10-CM | POA: Diagnosis not present

## 2019-04-13 DIAGNOSIS — Z20828 Contact with and (suspected) exposure to other viral communicable diseases: Secondary | ICD-10-CM | POA: Diagnosis not present

## 2019-04-17 DIAGNOSIS — I10 Essential (primary) hypertension: Secondary | ICD-10-CM | POA: Diagnosis not present

## 2019-04-17 DIAGNOSIS — J449 Chronic obstructive pulmonary disease, unspecified: Secondary | ICD-10-CM | POA: Diagnosis not present

## 2019-04-19 DIAGNOSIS — Z20828 Contact with and (suspected) exposure to other viral communicable diseases: Secondary | ICD-10-CM | POA: Diagnosis not present

## 2019-04-22 DIAGNOSIS — I10 Essential (primary) hypertension: Secondary | ICD-10-CM | POA: Diagnosis not present

## 2019-04-22 DIAGNOSIS — J449 Chronic obstructive pulmonary disease, unspecified: Secondary | ICD-10-CM | POA: Diagnosis not present

## 2019-04-25 DIAGNOSIS — Z7982 Long term (current) use of aspirin: Secondary | ICD-10-CM | POA: Diagnosis not present

## 2019-04-25 DIAGNOSIS — R2689 Other abnormalities of gait and mobility: Secondary | ICD-10-CM | POA: Diagnosis not present

## 2019-04-25 DIAGNOSIS — I69328 Other speech and language deficits following cerebral infarction: Secondary | ICD-10-CM | POA: Diagnosis not present

## 2019-04-25 DIAGNOSIS — M6281 Muscle weakness (generalized): Secondary | ICD-10-CM | POA: Diagnosis not present

## 2019-04-25 DIAGNOSIS — Z8701 Personal history of pneumonia (recurrent): Secondary | ICD-10-CM | POA: Diagnosis not present

## 2019-04-25 DIAGNOSIS — Z87891 Personal history of nicotine dependence: Secondary | ICD-10-CM | POA: Diagnosis not present

## 2019-04-25 DIAGNOSIS — I2510 Atherosclerotic heart disease of native coronary artery without angina pectoris: Secondary | ICD-10-CM | POA: Diagnosis not present

## 2019-04-25 DIAGNOSIS — Z79899 Other long term (current) drug therapy: Secondary | ICD-10-CM | POA: Diagnosis not present

## 2019-04-25 DIAGNOSIS — I10 Essential (primary) hypertension: Secondary | ICD-10-CM | POA: Diagnosis not present

## 2019-04-25 DIAGNOSIS — J439 Emphysema, unspecified: Secondary | ICD-10-CM | POA: Diagnosis not present

## 2019-04-25 DIAGNOSIS — Z955 Presence of coronary angioplasty implant and graft: Secondary | ICD-10-CM | POA: Diagnosis not present

## 2019-04-25 DIAGNOSIS — R32 Unspecified urinary incontinence: Secondary | ICD-10-CM | POA: Diagnosis not present

## 2019-04-25 DIAGNOSIS — I69398 Other sequelae of cerebral infarction: Secondary | ICD-10-CM | POA: Diagnosis not present

## 2019-04-25 DIAGNOSIS — E876 Hypokalemia: Secondary | ICD-10-CM | POA: Diagnosis not present

## 2019-04-25 DIAGNOSIS — K219 Gastro-esophageal reflux disease without esophagitis: Secondary | ICD-10-CM | POA: Diagnosis not present

## 2019-04-26 DIAGNOSIS — J439 Emphysema, unspecified: Secondary | ICD-10-CM | POA: Diagnosis not present

## 2019-04-26 DIAGNOSIS — I69328 Other speech and language deficits following cerebral infarction: Secondary | ICD-10-CM | POA: Diagnosis not present

## 2019-04-26 DIAGNOSIS — I2510 Atherosclerotic heart disease of native coronary artery without angina pectoris: Secondary | ICD-10-CM | POA: Diagnosis not present

## 2019-04-26 DIAGNOSIS — M6281 Muscle weakness (generalized): Secondary | ICD-10-CM | POA: Diagnosis not present

## 2019-04-26 DIAGNOSIS — I69398 Other sequelae of cerebral infarction: Secondary | ICD-10-CM | POA: Diagnosis not present

## 2019-04-26 DIAGNOSIS — R2689 Other abnormalities of gait and mobility: Secondary | ICD-10-CM | POA: Diagnosis not present

## 2019-04-28 DIAGNOSIS — M6281 Muscle weakness (generalized): Secondary | ICD-10-CM | POA: Diagnosis not present

## 2019-04-28 DIAGNOSIS — I2510 Atherosclerotic heart disease of native coronary artery without angina pectoris: Secondary | ICD-10-CM | POA: Diagnosis not present

## 2019-04-28 DIAGNOSIS — J439 Emphysema, unspecified: Secondary | ICD-10-CM | POA: Diagnosis not present

## 2019-04-28 DIAGNOSIS — I69398 Other sequelae of cerebral infarction: Secondary | ICD-10-CM | POA: Diagnosis not present

## 2019-04-28 DIAGNOSIS — I69328 Other speech and language deficits following cerebral infarction: Secondary | ICD-10-CM | POA: Diagnosis not present

## 2019-04-28 DIAGNOSIS — R2689 Other abnormalities of gait and mobility: Secondary | ICD-10-CM | POA: Diagnosis not present

## 2019-04-29 DIAGNOSIS — J439 Emphysema, unspecified: Secondary | ICD-10-CM | POA: Diagnosis not present

## 2019-04-29 DIAGNOSIS — R2689 Other abnormalities of gait and mobility: Secondary | ICD-10-CM | POA: Diagnosis not present

## 2019-04-29 DIAGNOSIS — I69398 Other sequelae of cerebral infarction: Secondary | ICD-10-CM | POA: Diagnosis not present

## 2019-04-29 DIAGNOSIS — M6281 Muscle weakness (generalized): Secondary | ICD-10-CM | POA: Diagnosis not present

## 2019-04-29 DIAGNOSIS — I69328 Other speech and language deficits following cerebral infarction: Secondary | ICD-10-CM | POA: Diagnosis not present

## 2019-04-29 DIAGNOSIS — I2510 Atherosclerotic heart disease of native coronary artery without angina pectoris: Secondary | ICD-10-CM | POA: Diagnosis not present

## 2019-05-03 DIAGNOSIS — M6281 Muscle weakness (generalized): Secondary | ICD-10-CM | POA: Diagnosis not present

## 2019-05-03 DIAGNOSIS — R2689 Other abnormalities of gait and mobility: Secondary | ICD-10-CM | POA: Diagnosis not present

## 2019-05-03 DIAGNOSIS — I2510 Atherosclerotic heart disease of native coronary artery without angina pectoris: Secondary | ICD-10-CM | POA: Diagnosis not present

## 2019-05-03 DIAGNOSIS — I69328 Other speech and language deficits following cerebral infarction: Secondary | ICD-10-CM | POA: Diagnosis not present

## 2019-05-03 DIAGNOSIS — J439 Emphysema, unspecified: Secondary | ICD-10-CM | POA: Diagnosis not present

## 2019-05-03 DIAGNOSIS — I69398 Other sequelae of cerebral infarction: Secondary | ICD-10-CM | POA: Diagnosis not present

## 2019-05-04 DIAGNOSIS — R531 Weakness: Secondary | ICD-10-CM | POA: Diagnosis not present

## 2019-05-04 DIAGNOSIS — K219 Gastro-esophageal reflux disease without esophagitis: Secondary | ICD-10-CM | POA: Diagnosis not present

## 2019-05-04 DIAGNOSIS — I10 Essential (primary) hypertension: Secondary | ICD-10-CM | POA: Diagnosis not present

## 2019-05-04 DIAGNOSIS — R2689 Other abnormalities of gait and mobility: Secondary | ICD-10-CM | POA: Diagnosis not present

## 2019-05-04 DIAGNOSIS — I69398 Other sequelae of cerebral infarction: Secondary | ICD-10-CM | POA: Diagnosis not present

## 2019-05-04 DIAGNOSIS — M6281 Muscle weakness (generalized): Secondary | ICD-10-CM | POA: Diagnosis not present

## 2019-05-04 DIAGNOSIS — Z681 Body mass index (BMI) 19 or less, adult: Secondary | ICD-10-CM | POA: Diagnosis not present

## 2019-05-04 DIAGNOSIS — E876 Hypokalemia: Secondary | ICD-10-CM | POA: Diagnosis not present

## 2019-05-04 DIAGNOSIS — I6930 Unspecified sequelae of cerebral infarction: Secondary | ICD-10-CM | POA: Diagnosis not present

## 2019-05-04 DIAGNOSIS — I69328 Other speech and language deficits following cerebral infarction: Secondary | ICD-10-CM | POA: Diagnosis not present

## 2019-05-04 DIAGNOSIS — J438 Other emphysema: Secondary | ICD-10-CM | POA: Diagnosis not present

## 2019-05-04 DIAGNOSIS — J439 Emphysema, unspecified: Secondary | ICD-10-CM | POA: Diagnosis not present

## 2019-05-04 DIAGNOSIS — Z23 Encounter for immunization: Secondary | ICD-10-CM | POA: Diagnosis not present

## 2019-05-04 DIAGNOSIS — I2510 Atherosclerotic heart disease of native coronary artery without angina pectoris: Secondary | ICD-10-CM | POA: Diagnosis not present

## 2019-05-05 DIAGNOSIS — I69328 Other speech and language deficits following cerebral infarction: Secondary | ICD-10-CM | POA: Diagnosis not present

## 2019-05-05 DIAGNOSIS — J439 Emphysema, unspecified: Secondary | ICD-10-CM | POA: Diagnosis not present

## 2019-05-05 DIAGNOSIS — I2510 Atherosclerotic heart disease of native coronary artery without angina pectoris: Secondary | ICD-10-CM | POA: Diagnosis not present

## 2019-05-05 DIAGNOSIS — R2689 Other abnormalities of gait and mobility: Secondary | ICD-10-CM | POA: Diagnosis not present

## 2019-05-05 DIAGNOSIS — I69398 Other sequelae of cerebral infarction: Secondary | ICD-10-CM | POA: Diagnosis not present

## 2019-05-05 DIAGNOSIS — M6281 Muscle weakness (generalized): Secondary | ICD-10-CM | POA: Diagnosis not present

## 2019-05-06 DIAGNOSIS — E7800 Pure hypercholesterolemia, unspecified: Secondary | ICD-10-CM | POA: Diagnosis not present

## 2019-05-06 DIAGNOSIS — K219 Gastro-esophageal reflux disease without esophagitis: Secondary | ICD-10-CM | POA: Diagnosis not present

## 2019-05-06 DIAGNOSIS — I2510 Atherosclerotic heart disease of native coronary artery without angina pectoris: Secondary | ICD-10-CM | POA: Diagnosis not present

## 2019-05-06 DIAGNOSIS — Z8673 Personal history of transient ischemic attack (TIA), and cerebral infarction without residual deficits: Secondary | ICD-10-CM | POA: Diagnosis not present

## 2019-05-06 DIAGNOSIS — I69328 Other speech and language deficits following cerebral infarction: Secondary | ICD-10-CM | POA: Diagnosis not present

## 2019-05-06 DIAGNOSIS — J439 Emphysema, unspecified: Secondary | ICD-10-CM | POA: Diagnosis not present

## 2019-05-06 DIAGNOSIS — H9190 Unspecified hearing loss, unspecified ear: Secondary | ICD-10-CM | POA: Diagnosis not present

## 2019-05-06 DIAGNOSIS — M25551 Pain in right hip: Secondary | ICD-10-CM | POA: Diagnosis not present

## 2019-05-06 DIAGNOSIS — R55 Syncope and collapse: Secondary | ICD-10-CM | POA: Diagnosis not present

## 2019-05-06 DIAGNOSIS — Z7982 Long term (current) use of aspirin: Secondary | ICD-10-CM | POA: Diagnosis not present

## 2019-05-06 DIAGNOSIS — F419 Anxiety disorder, unspecified: Secondary | ICD-10-CM | POA: Diagnosis not present

## 2019-05-06 DIAGNOSIS — I69398 Other sequelae of cerebral infarction: Secondary | ICD-10-CM | POA: Diagnosis not present

## 2019-05-06 DIAGNOSIS — R2689 Other abnormalities of gait and mobility: Secondary | ICD-10-CM | POA: Diagnosis not present

## 2019-05-06 DIAGNOSIS — R1110 Vomiting, unspecified: Secondary | ICD-10-CM | POA: Diagnosis not present

## 2019-05-06 DIAGNOSIS — H548 Legal blindness, as defined in USA: Secondary | ICD-10-CM | POA: Diagnosis not present

## 2019-05-06 DIAGNOSIS — I10 Essential (primary) hypertension: Secondary | ICD-10-CM | POA: Diagnosis not present

## 2019-05-06 DIAGNOSIS — Z955 Presence of coronary angioplasty implant and graft: Secondary | ICD-10-CM | POA: Diagnosis not present

## 2019-05-06 DIAGNOSIS — Z79899 Other long term (current) drug therapy: Secondary | ICD-10-CM | POA: Diagnosis not present

## 2019-05-06 DIAGNOSIS — J449 Chronic obstructive pulmonary disease, unspecified: Secondary | ICD-10-CM | POA: Diagnosis not present

## 2019-05-06 DIAGNOSIS — Z87891 Personal history of nicotine dependence: Secondary | ICD-10-CM | POA: Diagnosis not present

## 2019-05-06 DIAGNOSIS — M6281 Muscle weakness (generalized): Secondary | ICD-10-CM | POA: Diagnosis not present

## 2019-05-06 DIAGNOSIS — I252 Old myocardial infarction: Secondary | ICD-10-CM | POA: Diagnosis not present

## 2019-05-06 DIAGNOSIS — R262 Difficulty in walking, not elsewhere classified: Secondary | ICD-10-CM | POA: Diagnosis not present

## 2019-05-06 DIAGNOSIS — K5900 Constipation, unspecified: Secondary | ICD-10-CM | POA: Diagnosis not present

## 2019-05-06 DIAGNOSIS — R32 Unspecified urinary incontinence: Secondary | ICD-10-CM | POA: Diagnosis not present

## 2019-05-08 DIAGNOSIS — I10 Essential (primary) hypertension: Secondary | ICD-10-CM | POA: Diagnosis not present

## 2019-05-08 DIAGNOSIS — R55 Syncope and collapse: Secondary | ICD-10-CM | POA: Diagnosis not present

## 2019-05-09 DIAGNOSIS — J439 Emphysema, unspecified: Secondary | ICD-10-CM | POA: Diagnosis not present

## 2019-05-09 DIAGNOSIS — R2689 Other abnormalities of gait and mobility: Secondary | ICD-10-CM | POA: Diagnosis not present

## 2019-05-09 DIAGNOSIS — M6281 Muscle weakness (generalized): Secondary | ICD-10-CM | POA: Diagnosis not present

## 2019-05-09 DIAGNOSIS — I69328 Other speech and language deficits following cerebral infarction: Secondary | ICD-10-CM | POA: Diagnosis not present

## 2019-05-09 DIAGNOSIS — I69398 Other sequelae of cerebral infarction: Secondary | ICD-10-CM | POA: Diagnosis not present

## 2019-05-09 DIAGNOSIS — I2510 Atherosclerotic heart disease of native coronary artery without angina pectoris: Secondary | ICD-10-CM | POA: Diagnosis not present

## 2019-05-10 DIAGNOSIS — M6281 Muscle weakness (generalized): Secondary | ICD-10-CM | POA: Diagnosis not present

## 2019-05-10 DIAGNOSIS — J439 Emphysema, unspecified: Secondary | ICD-10-CM | POA: Diagnosis not present

## 2019-05-10 DIAGNOSIS — R2689 Other abnormalities of gait and mobility: Secondary | ICD-10-CM | POA: Diagnosis not present

## 2019-05-10 DIAGNOSIS — I2510 Atherosclerotic heart disease of native coronary artery without angina pectoris: Secondary | ICD-10-CM | POA: Diagnosis not present

## 2019-05-10 DIAGNOSIS — I69328 Other speech and language deficits following cerebral infarction: Secondary | ICD-10-CM | POA: Diagnosis not present

## 2019-05-10 DIAGNOSIS — I69398 Other sequelae of cerebral infarction: Secondary | ICD-10-CM | POA: Diagnosis not present

## 2019-05-11 DIAGNOSIS — I69398 Other sequelae of cerebral infarction: Secondary | ICD-10-CM | POA: Diagnosis not present

## 2019-05-11 DIAGNOSIS — R2689 Other abnormalities of gait and mobility: Secondary | ICD-10-CM | POA: Diagnosis not present

## 2019-05-11 DIAGNOSIS — J439 Emphysema, unspecified: Secondary | ICD-10-CM | POA: Diagnosis not present

## 2019-05-11 DIAGNOSIS — I69328 Other speech and language deficits following cerebral infarction: Secondary | ICD-10-CM | POA: Diagnosis not present

## 2019-05-11 DIAGNOSIS — M6281 Muscle weakness (generalized): Secondary | ICD-10-CM | POA: Diagnosis not present

## 2019-05-11 DIAGNOSIS — I2510 Atherosclerotic heart disease of native coronary artery without angina pectoris: Secondary | ICD-10-CM | POA: Diagnosis not present

## 2019-05-18 DIAGNOSIS — I2510 Atherosclerotic heart disease of native coronary artery without angina pectoris: Secondary | ICD-10-CM | POA: Diagnosis not present

## 2019-05-18 DIAGNOSIS — I69398 Other sequelae of cerebral infarction: Secondary | ICD-10-CM | POA: Diagnosis not present

## 2019-05-18 DIAGNOSIS — J439 Emphysema, unspecified: Secondary | ICD-10-CM | POA: Diagnosis not present

## 2019-05-18 DIAGNOSIS — I69328 Other speech and language deficits following cerebral infarction: Secondary | ICD-10-CM | POA: Diagnosis not present

## 2019-05-18 DIAGNOSIS — M6281 Muscle weakness (generalized): Secondary | ICD-10-CM | POA: Diagnosis not present

## 2019-05-18 DIAGNOSIS — R2689 Other abnormalities of gait and mobility: Secondary | ICD-10-CM | POA: Diagnosis not present

## 2019-05-25 DIAGNOSIS — E876 Hypokalemia: Secondary | ICD-10-CM | POA: Diagnosis not present

## 2019-05-25 DIAGNOSIS — I2510 Atherosclerotic heart disease of native coronary artery without angina pectoris: Secondary | ICD-10-CM | POA: Diagnosis not present

## 2019-05-25 DIAGNOSIS — R2689 Other abnormalities of gait and mobility: Secondary | ICD-10-CM | POA: Diagnosis not present

## 2019-05-25 DIAGNOSIS — Z7982 Long term (current) use of aspirin: Secondary | ICD-10-CM | POA: Diagnosis not present

## 2019-05-25 DIAGNOSIS — I69328 Other speech and language deficits following cerebral infarction: Secondary | ICD-10-CM | POA: Diagnosis not present

## 2019-05-25 DIAGNOSIS — M6281 Muscle weakness (generalized): Secondary | ICD-10-CM | POA: Diagnosis not present

## 2019-05-25 DIAGNOSIS — Z955 Presence of coronary angioplasty implant and graft: Secondary | ICD-10-CM | POA: Diagnosis not present

## 2019-05-25 DIAGNOSIS — K219 Gastro-esophageal reflux disease without esophagitis: Secondary | ICD-10-CM | POA: Diagnosis not present

## 2019-05-25 DIAGNOSIS — I10 Essential (primary) hypertension: Secondary | ICD-10-CM | POA: Diagnosis not present

## 2019-05-25 DIAGNOSIS — R32 Unspecified urinary incontinence: Secondary | ICD-10-CM | POA: Diagnosis not present

## 2019-05-25 DIAGNOSIS — Z8701 Personal history of pneumonia (recurrent): Secondary | ICD-10-CM | POA: Diagnosis not present

## 2019-05-25 DIAGNOSIS — I69398 Other sequelae of cerebral infarction: Secondary | ICD-10-CM | POA: Diagnosis not present

## 2019-05-25 DIAGNOSIS — Z79899 Other long term (current) drug therapy: Secondary | ICD-10-CM | POA: Diagnosis not present

## 2019-05-25 DIAGNOSIS — Z87891 Personal history of nicotine dependence: Secondary | ICD-10-CM | POA: Diagnosis not present

## 2019-05-25 DIAGNOSIS — J439 Emphysema, unspecified: Secondary | ICD-10-CM | POA: Diagnosis not present

## 2019-06-07 DIAGNOSIS — R2689 Other abnormalities of gait and mobility: Secondary | ICD-10-CM | POA: Diagnosis not present

## 2019-06-07 DIAGNOSIS — R410 Disorientation, unspecified: Secondary | ICD-10-CM | POA: Diagnosis not present

## 2019-06-07 DIAGNOSIS — M6281 Muscle weakness (generalized): Secondary | ICD-10-CM | POA: Diagnosis not present

## 2019-06-07 DIAGNOSIS — I69398 Other sequelae of cerebral infarction: Secondary | ICD-10-CM | POA: Diagnosis not present

## 2019-06-07 DIAGNOSIS — I69328 Other speech and language deficits following cerebral infarction: Secondary | ICD-10-CM | POA: Diagnosis not present

## 2019-06-07 DIAGNOSIS — I2510 Atherosclerotic heart disease of native coronary artery without angina pectoris: Secondary | ICD-10-CM | POA: Diagnosis not present

## 2019-06-07 DIAGNOSIS — R55 Syncope and collapse: Secondary | ICD-10-CM | POA: Diagnosis not present

## 2019-06-07 DIAGNOSIS — R630 Anorexia: Secondary | ICD-10-CM | POA: Diagnosis not present

## 2019-06-07 DIAGNOSIS — J439 Emphysema, unspecified: Secondary | ICD-10-CM | POA: Diagnosis not present

## 2019-06-20 DIAGNOSIS — I69328 Other speech and language deficits following cerebral infarction: Secondary | ICD-10-CM | POA: Diagnosis not present

## 2019-06-20 DIAGNOSIS — I69398 Other sequelae of cerebral infarction: Secondary | ICD-10-CM | POA: Diagnosis not present

## 2019-06-20 DIAGNOSIS — R2689 Other abnormalities of gait and mobility: Secondary | ICD-10-CM | POA: Diagnosis not present

## 2019-06-20 DIAGNOSIS — I2510 Atherosclerotic heart disease of native coronary artery without angina pectoris: Secondary | ICD-10-CM | POA: Diagnosis not present

## 2019-06-20 DIAGNOSIS — J439 Emphysema, unspecified: Secondary | ICD-10-CM | POA: Diagnosis not present

## 2019-06-20 DIAGNOSIS — M6281 Muscle weakness (generalized): Secondary | ICD-10-CM | POA: Diagnosis not present

## 2019-09-01 DIAGNOSIS — I10 Essential (primary) hypertension: Secondary | ICD-10-CM | POA: Diagnosis not present

## 2019-09-01 DIAGNOSIS — K219 Gastro-esophageal reflux disease without esophagitis: Secondary | ICD-10-CM | POA: Diagnosis not present

## 2019-09-01 DIAGNOSIS — E876 Hypokalemia: Secondary | ICD-10-CM | POA: Diagnosis not present

## 2019-09-01 DIAGNOSIS — E7800 Pure hypercholesterolemia, unspecified: Secondary | ICD-10-CM | POA: Diagnosis not present

## 2019-09-06 DIAGNOSIS — H547 Unspecified visual loss: Secondary | ICD-10-CM | POA: Diagnosis not present

## 2019-09-06 DIAGNOSIS — E876 Hypokalemia: Secondary | ICD-10-CM | POA: Diagnosis not present

## 2019-09-06 DIAGNOSIS — H9190 Unspecified hearing loss, unspecified ear: Secondary | ICD-10-CM | POA: Diagnosis not present

## 2019-09-06 DIAGNOSIS — I10 Essential (primary) hypertension: Secondary | ICD-10-CM | POA: Diagnosis not present

## 2019-09-06 DIAGNOSIS — K219 Gastro-esophageal reflux disease without esophagitis: Secondary | ICD-10-CM | POA: Diagnosis not present

## 2019-09-06 DIAGNOSIS — J438 Other emphysema: Secondary | ICD-10-CM | POA: Diagnosis not present

## 2019-09-06 DIAGNOSIS — I6930 Unspecified sequelae of cerebral infarction: Secondary | ICD-10-CM | POA: Diagnosis not present

## 2019-09-06 DIAGNOSIS — R634 Abnormal weight loss: Secondary | ICD-10-CM | POA: Diagnosis not present

## 2019-09-24 DIAGNOSIS — M6281 Muscle weakness (generalized): Secondary | ICD-10-CM | POA: Diagnosis not present

## 2019-09-24 DIAGNOSIS — I639 Cerebral infarction, unspecified: Secondary | ICD-10-CM | POA: Diagnosis not present

## 2019-09-24 DIAGNOSIS — J449 Chronic obstructive pulmonary disease, unspecified: Secondary | ICD-10-CM | POA: Diagnosis not present

## 2019-10-14 DIAGNOSIS — J441 Chronic obstructive pulmonary disease with (acute) exacerbation: Secondary | ICD-10-CM | POA: Diagnosis not present

## 2019-10-14 DIAGNOSIS — R0689 Other abnormalities of breathing: Secondary | ICD-10-CM | POA: Diagnosis not present

## 2019-10-14 DIAGNOSIS — R1110 Vomiting, unspecified: Secondary | ICD-10-CM | POA: Diagnosis not present

## 2019-10-14 DIAGNOSIS — Z955 Presence of coronary angioplasty implant and graft: Secondary | ICD-10-CM | POA: Diagnosis not present

## 2019-10-14 DIAGNOSIS — K5900 Constipation, unspecified: Secondary | ICD-10-CM | POA: Diagnosis not present

## 2019-10-14 DIAGNOSIS — Z7982 Long term (current) use of aspirin: Secondary | ICD-10-CM | POA: Diagnosis not present

## 2019-10-14 DIAGNOSIS — R404 Transient alteration of awareness: Secondary | ICD-10-CM | POA: Diagnosis not present

## 2019-10-14 DIAGNOSIS — E785 Hyperlipidemia, unspecified: Secondary | ICD-10-CM | POA: Diagnosis not present

## 2019-10-14 DIAGNOSIS — R509 Fever, unspecified: Secondary | ICD-10-CM | POA: Diagnosis not present

## 2019-10-14 DIAGNOSIS — J189 Pneumonia, unspecified organism: Secondary | ICD-10-CM | POA: Diagnosis not present

## 2019-10-14 DIAGNOSIS — H9190 Unspecified hearing loss, unspecified ear: Secondary | ICD-10-CM | POA: Diagnosis not present

## 2019-10-14 DIAGNOSIS — Z8673 Personal history of transient ischemic attack (TIA), and cerebral infarction without residual deficits: Secondary | ICD-10-CM | POA: Diagnosis not present

## 2019-10-14 DIAGNOSIS — I10 Essential (primary) hypertension: Secondary | ICD-10-CM | POA: Diagnosis not present

## 2019-10-14 DIAGNOSIS — J440 Chronic obstructive pulmonary disease with acute lower respiratory infection: Secondary | ICD-10-CM | POA: Diagnosis not present

## 2019-10-14 DIAGNOSIS — R531 Weakness: Secondary | ICD-10-CM | POA: Diagnosis not present

## 2019-10-14 DIAGNOSIS — R4182 Altered mental status, unspecified: Secondary | ICD-10-CM | POA: Diagnosis not present

## 2019-10-14 DIAGNOSIS — I2510 Atherosclerotic heart disease of native coronary artery without angina pectoris: Secondary | ICD-10-CM | POA: Diagnosis not present

## 2019-10-14 DIAGNOSIS — Z87891 Personal history of nicotine dependence: Secondary | ICD-10-CM | POA: Diagnosis not present

## 2019-10-15 DIAGNOSIS — J441 Chronic obstructive pulmonary disease with (acute) exacerbation: Secondary | ICD-10-CM | POA: Diagnosis not present

## 2019-10-15 DIAGNOSIS — H9190 Unspecified hearing loss, unspecified ear: Secondary | ICD-10-CM | POA: Diagnosis not present

## 2019-10-15 DIAGNOSIS — J189 Pneumonia, unspecified organism: Secondary | ICD-10-CM | POA: Diagnosis not present

## 2019-10-15 DIAGNOSIS — I2510 Atherosclerotic heart disease of native coronary artery without angina pectoris: Secondary | ICD-10-CM | POA: Diagnosis not present

## 2019-10-25 DIAGNOSIS — M6281 Muscle weakness (generalized): Secondary | ICD-10-CM | POA: Diagnosis not present

## 2019-10-25 DIAGNOSIS — J449 Chronic obstructive pulmonary disease, unspecified: Secondary | ICD-10-CM | POA: Diagnosis not present

## 2019-10-25 DIAGNOSIS — I639 Cerebral infarction, unspecified: Secondary | ICD-10-CM | POA: Diagnosis not present

## 2019-10-27 DIAGNOSIS — J438 Other emphysema: Secondary | ICD-10-CM | POA: Diagnosis not present

## 2019-10-27 DIAGNOSIS — E782 Mixed hyperlipidemia: Secondary | ICD-10-CM | POA: Diagnosis not present

## 2019-10-27 DIAGNOSIS — J189 Pneumonia, unspecified organism: Secondary | ICD-10-CM | POA: Diagnosis not present

## 2019-10-27 DIAGNOSIS — I10 Essential (primary) hypertension: Secondary | ICD-10-CM | POA: Diagnosis not present

## 2019-10-27 DIAGNOSIS — I6930 Unspecified sequelae of cerebral infarction: Secondary | ICD-10-CM | POA: Diagnosis not present

## 2019-11-24 DIAGNOSIS — M6281 Muscle weakness (generalized): Secondary | ICD-10-CM | POA: Diagnosis not present

## 2019-11-24 DIAGNOSIS — I639 Cerebral infarction, unspecified: Secondary | ICD-10-CM | POA: Diagnosis not present

## 2019-11-24 DIAGNOSIS — J449 Chronic obstructive pulmonary disease, unspecified: Secondary | ICD-10-CM | POA: Diagnosis not present

## 2019-11-25 DIAGNOSIS — J189 Pneumonia, unspecified organism: Secondary | ICD-10-CM | POA: Diagnosis not present

## 2019-12-25 DIAGNOSIS — M6281 Muscle weakness (generalized): Secondary | ICD-10-CM | POA: Diagnosis not present

## 2020-01-24 DIAGNOSIS — J449 Chronic obstructive pulmonary disease, unspecified: Secondary | ICD-10-CM | POA: Diagnosis not present

## 2020-01-24 DIAGNOSIS — M6281 Muscle weakness (generalized): Secondary | ICD-10-CM | POA: Diagnosis not present

## 2020-02-24 DIAGNOSIS — J449 Chronic obstructive pulmonary disease, unspecified: Secondary | ICD-10-CM | POA: Diagnosis not present

## 2020-02-24 DIAGNOSIS — M6281 Muscle weakness (generalized): Secondary | ICD-10-CM | POA: Diagnosis not present

## 2020-03-05 DIAGNOSIS — I10 Essential (primary) hypertension: Secondary | ICD-10-CM | POA: Diagnosis not present

## 2020-03-05 DIAGNOSIS — E876 Hypokalemia: Secondary | ICD-10-CM | POA: Diagnosis not present

## 2020-03-05 DIAGNOSIS — R634 Abnormal weight loss: Secondary | ICD-10-CM | POA: Diagnosis not present

## 2020-03-05 DIAGNOSIS — H9190 Unspecified hearing loss, unspecified ear: Secondary | ICD-10-CM | POA: Diagnosis not present

## 2020-03-05 DIAGNOSIS — H547 Unspecified visual loss: Secondary | ICD-10-CM | POA: Diagnosis not present

## 2020-03-05 DIAGNOSIS — Z1331 Encounter for screening for depression: Secondary | ICD-10-CM | POA: Diagnosis not present

## 2020-03-05 DIAGNOSIS — I6930 Unspecified sequelae of cerebral infarction: Secondary | ICD-10-CM | POA: Diagnosis not present

## 2020-03-05 DIAGNOSIS — Z1389 Encounter for screening for other disorder: Secondary | ICD-10-CM | POA: Diagnosis not present

## 2020-03-26 DIAGNOSIS — J449 Chronic obstructive pulmonary disease, unspecified: Secondary | ICD-10-CM | POA: Diagnosis not present

## 2020-03-26 DIAGNOSIS — M6281 Muscle weakness (generalized): Secondary | ICD-10-CM | POA: Diagnosis not present

## 2020-04-18 DIAGNOSIS — R350 Frequency of micturition: Secondary | ICD-10-CM | POA: Diagnosis not present

## 2020-04-18 DIAGNOSIS — Z681 Body mass index (BMI) 19 or less, adult: Secondary | ICD-10-CM | POA: Diagnosis not present

## 2020-04-23 DIAGNOSIS — R300 Dysuria: Secondary | ICD-10-CM | POA: Diagnosis not present

## 2020-04-25 DIAGNOSIS — M6281 Muscle weakness (generalized): Secondary | ICD-10-CM | POA: Diagnosis not present

## 2020-04-25 DIAGNOSIS — J449 Chronic obstructive pulmonary disease, unspecified: Secondary | ICD-10-CM | POA: Diagnosis not present

## 2020-05-26 DIAGNOSIS — I639 Cerebral infarction, unspecified: Secondary | ICD-10-CM | POA: Diagnosis not present

## 2020-05-26 DIAGNOSIS — M6281 Muscle weakness (generalized): Secondary | ICD-10-CM | POA: Diagnosis not present

## 2020-06-25 DIAGNOSIS — I639 Cerebral infarction, unspecified: Secondary | ICD-10-CM | POA: Diagnosis not present

## 2020-06-25 DIAGNOSIS — J449 Chronic obstructive pulmonary disease, unspecified: Secondary | ICD-10-CM | POA: Diagnosis not present

## 2020-06-25 DIAGNOSIS — M6281 Muscle weakness (generalized): Secondary | ICD-10-CM | POA: Diagnosis not present

## 2020-07-26 DIAGNOSIS — J449 Chronic obstructive pulmonary disease, unspecified: Secondary | ICD-10-CM | POA: Diagnosis not present

## 2020-07-26 DIAGNOSIS — M6281 Muscle weakness (generalized): Secondary | ICD-10-CM | POA: Diagnosis not present

## 2021-04-02 IMAGING — CT CT HEAD WITHOUT CONTRAST
3 series · 16 of 47 positions shown, 19 images · non-contrast
Comparison: 11/18/2018

CLINICAL DATA: Unresponsive

EXAM:
CT HEAD WITHOUT CONTRAST
TECHNIQUE: Contiguous axial images were obtained from the base of the skull
through the vertex without intravenous contrast.

[Series 3: head w o · axial · 0.42mm/px · z∈[+1520,+1645]mm · 10 of 31 slices shown, 13 images]
[im 3/31  brain]
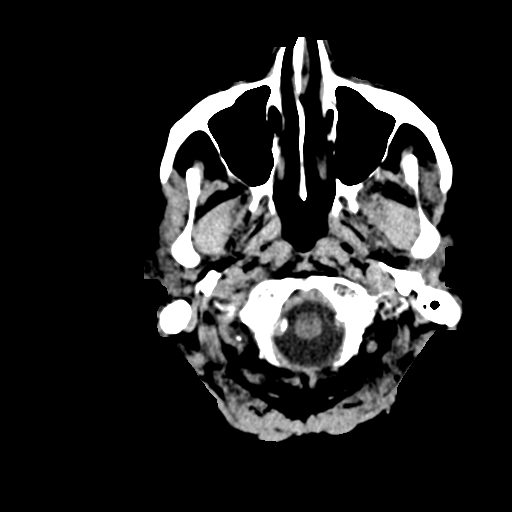
[im 3/31  bone]
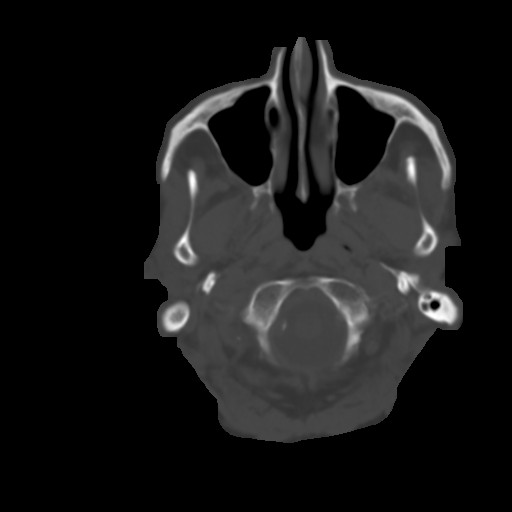
[im 6/31  brain]
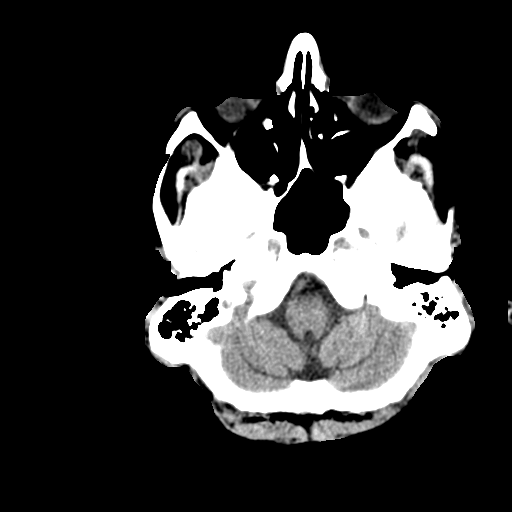
[im 9/31  brain]
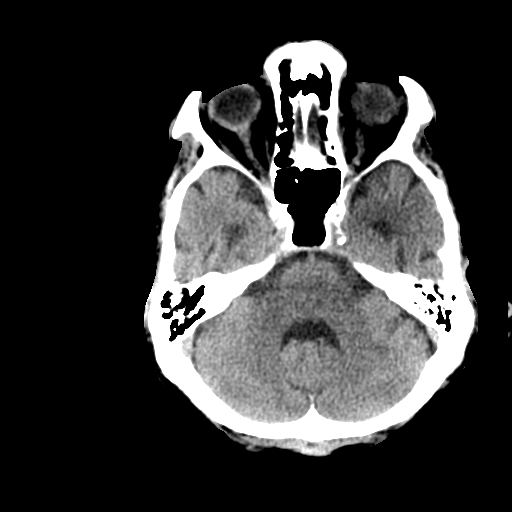
[im 11/31  brain]
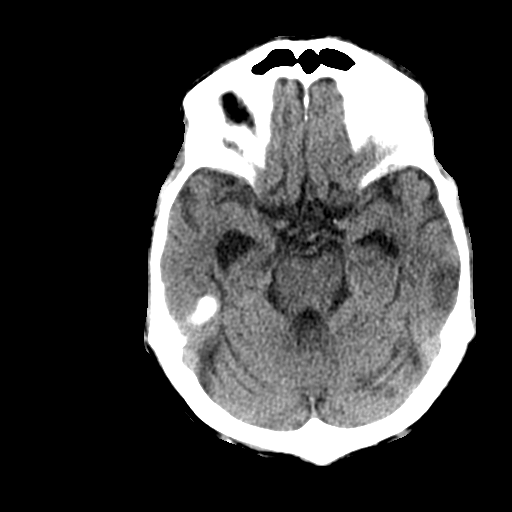
[im 14/31  brain]
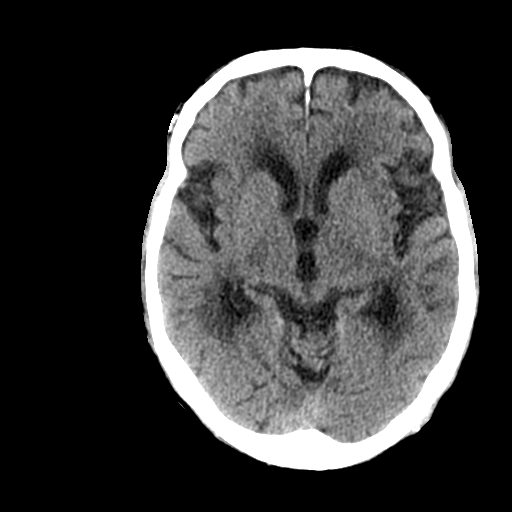
[im 14/31  bone]
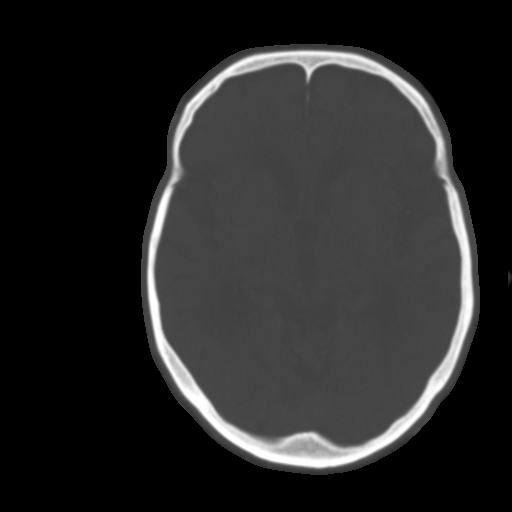
[im 17/31  brain]
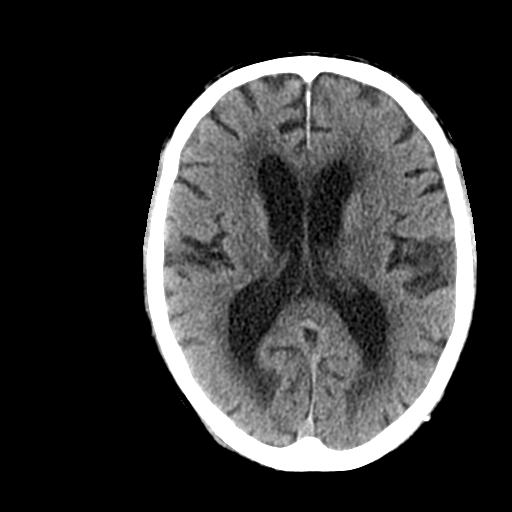
[im 20/31  brain]
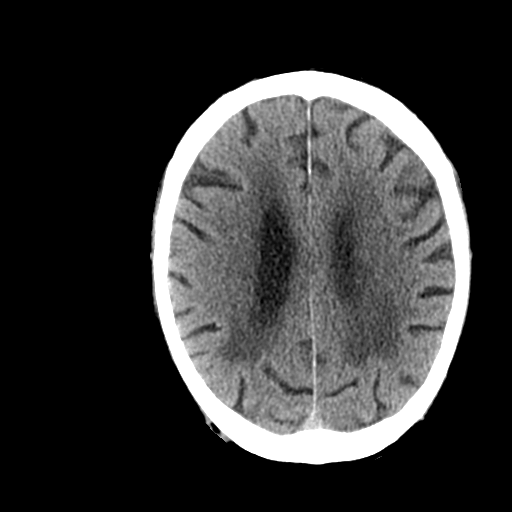
[im 23/31  brain]
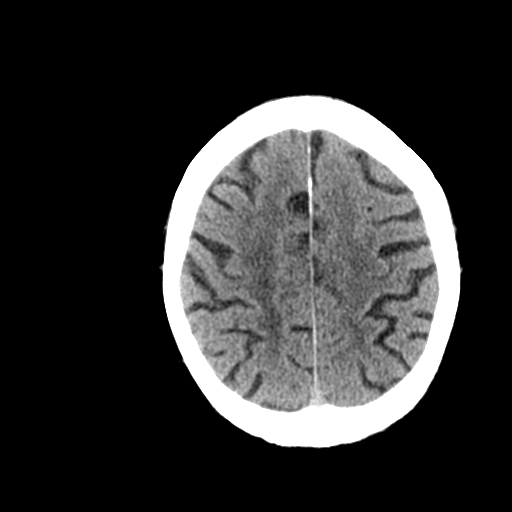
[im 25/31  brain]
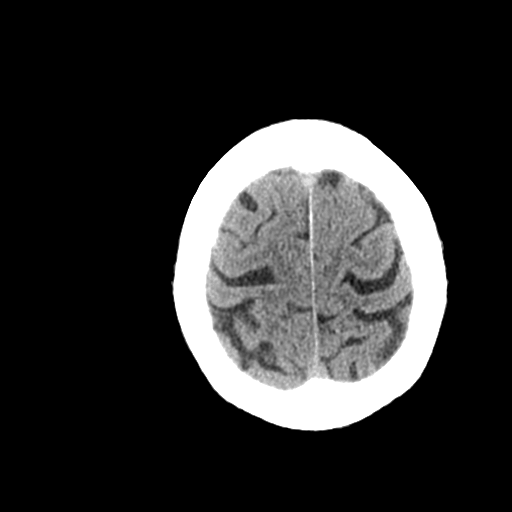
[im 25/31  bone]
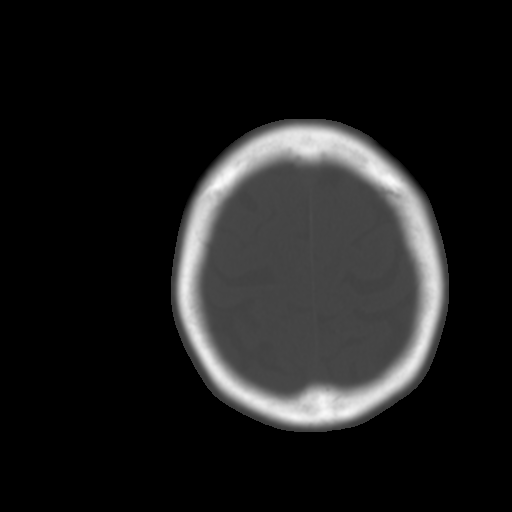
[im 28/31  brain]
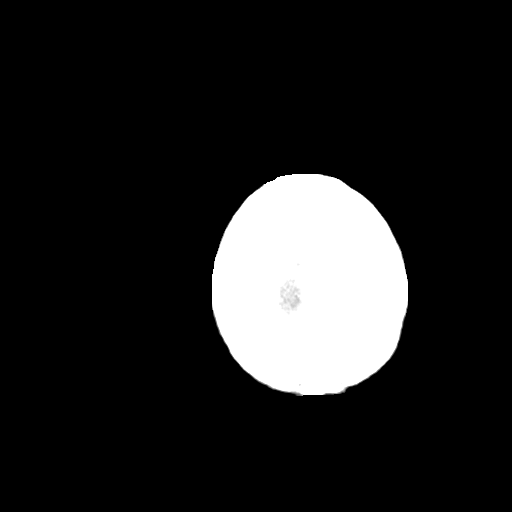

[Series 4: coronal soft · coronal · 0.34mm/px · 3 of 67 slices shown]
[im 23/67  brain]
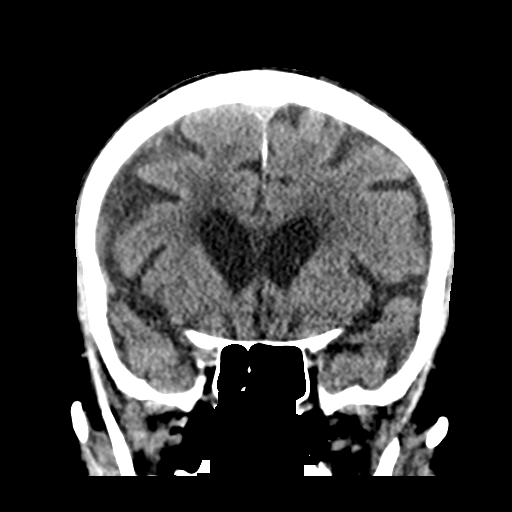
[im 30/67  brain]
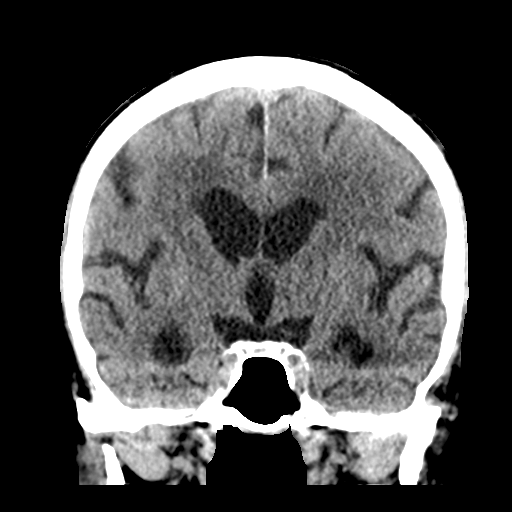
[im 37/67  brain]
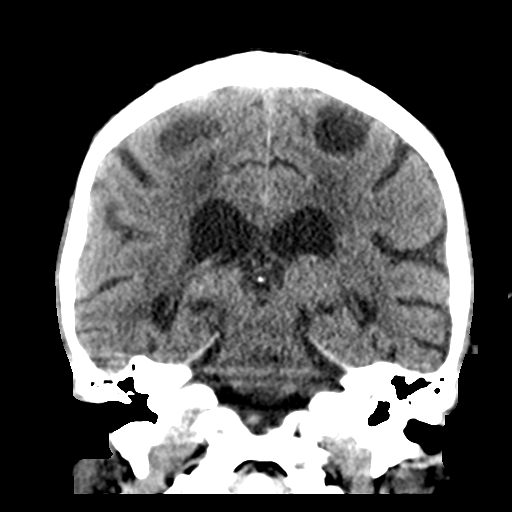

[Series 5: sagittal soft · sagittal · 0.32mm/px · 3 of 57 slices shown]
[im 19/57  brain]
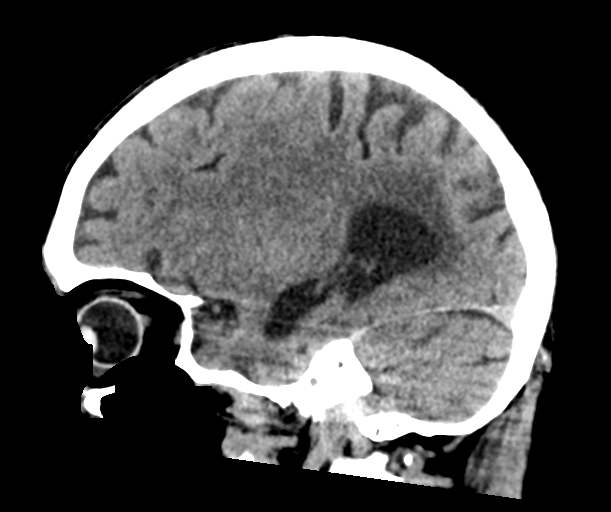
[im 29/57  brain]
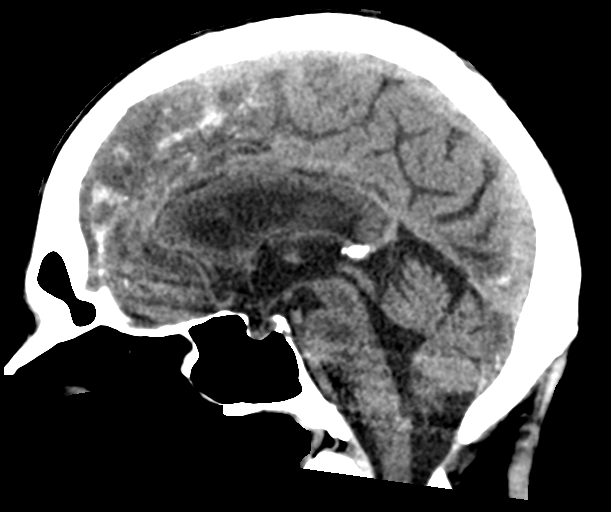
[im 38/57  brain]
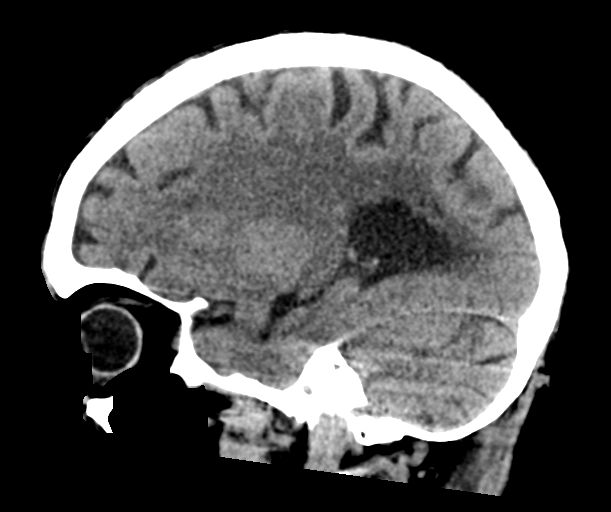

[16 of 47 positions shown; findings below may reference images not displayed]

FINDINGS: Brain: No evidence of acute infarction, hemorrhage, hydrocephalus,
extra-axial collection or mass lesion/mass effect. Periventricular
and deep white matter hypodensity. Lacunar infarction of the right
caudate and corona radiata.

Vascular: No hyperdense vessel or unexpected calcification.

Skull: Normal. Negative for fracture or focal lesion.

Sinuses/Orbits: No acute finding.

Other: None.
IMPRESSION: No acute intracranial pathology. Small-vessel white matter disease
in keeping with patient age. Nonacute lacunar infarctions of the
right basal ganglia.

## 2021-10-16 ENCOUNTER — Institutional Professional Consult (permissible substitution): Payer: Medicare Other | Admitting: Pulmonary Disease

## 2021-12-02 ENCOUNTER — Institutional Professional Consult (permissible substitution): Payer: Medicare Other | Admitting: Internal Medicine

## 2021-12-02 NOTE — Progress Notes (Incomplete)
? ?  Tyler Watkins Doctor, male    DOB: 01/20/1932, 88 y.o.   MRN: [id number] ? ? ?Brief patient profile:  ?***  yo*** *** referred to pulmonary clinic in [place]  12/02/2021 by *** for ***  ? ? ? ? ?History of Present Illness  ?12/02/2021  Pulmonary/ 1st office eval/ [name] / [name]  ?No chief complaint on file. ?  ? ?Dyspnea:  *** ?Cough: *** ?Sleep: *** ?SABA use:  ? ?Past Medical History:  ?Diagnosis Date  ? COPD (chronic obstructive pulmonary disease) ([place])   ? Hypertension   ? ? ?Outpatient Medications Prior to Visit  ?Medication Sig Dispense Refill  ? aspirin EC 81 MG tablet Take 81 mg by mouth daily.    ? HYDROcodone-acetaminophen (NORCO) 5-325 MG per tablet Take 1 tablet by mouth every 6 (six) hours as needed. 10 tablet 0  ? ?No facility-administered medications prior to visit.  ? ? ? ?Objective:  ?  ? ?There were no vitals taken for this visit. ? ?  ? ? ?   ?Assessment  ? ?No problem-specific Assessment & Plan notes found for this encounter. ? ? ? ? ?[name], MD ?12/02/2021 ?   ?

## 2022-01-20 ENCOUNTER — Institutional Professional Consult (permissible substitution): Payer: Medicare Other | Admitting: Internal Medicine

## 2022-01-20 NOTE — Progress Notes (Deleted)
   Tyler Watkins, male    DOB: 1931-09-21   MRN: [id number]   Brief patient profile:  ***  *** referred to pulmonary clinic 01/20/2022 by *** for ***      PFTs nl 05/25/2001   History of Present Illness  01/20/2022  Pulmonary/ 1st office eval/Uvaldo Rybacki  No chief complaint on file.    Dyspnea:  *** Cough: *** Sleep: *** SABA use:   Past Medical History:  Diagnosis Date   COPD (chronic obstructive pulmonary disease) ([facility])    Hypertension     Outpatient Medications Prior to Visit  Medication Sig Dispense Refill   aspirin EC 81 MG tablet Take 81 mg by mouth daily.     HYDROcodone-acetaminophen (NORCO) 5-325 MG per tablet Take 1 tablet by mouth every 6 (six) hours as needed. 10 tablet 0   No facility-administered medications prior to visit.     Objective:     There were no vitals taken for this visit.         Assessment   No problem-specific Assessment & Plan notes found for this encounter.     Sandrea Hughs, MD 01/20/2022

## 2022-09-19 DEATH — deceased
